# Patient Record
Sex: Female | Born: 1970
Health system: Southern US, Community
[De-identification: ages and names within clinical notes are randomized; demographics above are authoritative.]

## PROBLEM LIST (undated history)

## (undated) DIAGNOSIS — E119 Type 2 diabetes mellitus without complications: Secondary | ICD-10-CM

## (undated) DIAGNOSIS — I10 Essential (primary) hypertension: Secondary | ICD-10-CM

## (undated) DIAGNOSIS — E785 Hyperlipidemia, unspecified: Secondary | ICD-10-CM

## (undated) DIAGNOSIS — G43909 Migraine, unspecified, not intractable, without status migrainosus: Secondary | ICD-10-CM

## (undated) DIAGNOSIS — I219 Acute myocardial infarction, unspecified: Secondary | ICD-10-CM

## (undated) DIAGNOSIS — Z8619 Personal history of other infectious and parasitic diseases: Secondary | ICD-10-CM

## (undated) DIAGNOSIS — E7800 Pure hypercholesterolemia, unspecified: Secondary | ICD-10-CM

## (undated) HISTORY — DX: Hyperlipidemia, unspecified: E78.5

## (undated) HISTORY — PX: ELBOW SURGERY: SHX618

## (undated) HISTORY — DX: Migraine, unspecified, not intractable, without status migrainosus: G43.909

## (undated) HISTORY — DX: Personal history of other infectious and parasitic diseases: Z86.19

## (undated) HISTORY — DX: Essential (primary) hypertension: I10

## (undated) HISTORY — DX: Type 2 diabetes mellitus without complications: E11.9

---

## 2001-12-27 ENCOUNTER — Encounter: Payer: Self-pay | Admitting: *Deleted

## 2001-12-27 ENCOUNTER — Ambulatory Visit (HOSPITAL_COMMUNITY): Admission: RE | Admit: 2001-12-27 | Discharge: 2001-12-27 | Payer: Self-pay | Admitting: *Deleted

## 2002-03-29 ENCOUNTER — Encounter: Payer: Self-pay | Admitting: *Deleted

## 2002-03-29 ENCOUNTER — Ambulatory Visit (HOSPITAL_COMMUNITY): Admission: RE | Admit: 2002-03-29 | Discharge: 2002-03-29 | Payer: Self-pay | Admitting: *Deleted

## 2002-05-22 ENCOUNTER — Inpatient Hospital Stay (HOSPITAL_COMMUNITY): Admission: AD | Admit: 2002-05-22 | Discharge: 2002-05-24 | Payer: Self-pay | Admitting: *Deleted

## 2003-06-27 ENCOUNTER — Encounter (HOSPITAL_COMMUNITY): Admission: RE | Admit: 2003-06-27 | Discharge: 2003-07-27 | Payer: Self-pay | Admitting: Preventative Medicine

## 2004-03-13 ENCOUNTER — Ambulatory Visit (HOSPITAL_COMMUNITY): Admission: RE | Admit: 2004-03-13 | Discharge: 2004-03-13 | Payer: Self-pay | Admitting: Neurology

## 2004-07-28 ENCOUNTER — Emergency Department (HOSPITAL_COMMUNITY): Admission: EM | Admit: 2004-07-28 | Discharge: 2004-07-29 | Payer: Self-pay | Admitting: *Deleted

## 2004-08-06 ENCOUNTER — Encounter (HOSPITAL_COMMUNITY): Admission: RE | Admit: 2004-08-06 | Discharge: 2004-08-28 | Payer: Self-pay | Admitting: Preventative Medicine

## 2006-06-30 ENCOUNTER — Emergency Department (HOSPITAL_COMMUNITY): Admission: EM | Admit: 2006-06-30 | Discharge: 2006-06-30 | Payer: Self-pay | Admitting: Emergency Medicine

## 2007-10-04 ENCOUNTER — Emergency Department (HOSPITAL_COMMUNITY): Admission: EM | Admit: 2007-10-04 | Discharge: 2007-10-04 | Payer: Self-pay | Admitting: Emergency Medicine

## 2011-01-15 NOTE — Op Note (Signed)
Jaime Walls, Jaime Walls                      ACCOUNT NO.:  [id number]   MEDICAL RECORD NO.:  [id number]                   PATIENT TYPE:  INP   LOCATION:  A417                                 FACILITY:  [facility]   PHYSICIAN:  Roylene Reason. Lisette Grinder, M.D.             DATE OF BIRTH:  06/17/1971   DATE OF PROCEDURE:  05/22/2002  DATE OF DISCHARGE:                                 OPERATIVE REPORT   DELIVERY NOTE:   DIAGNOSIS:  1. A 39 week uterine pregnancy presenting in labor.  2. Moderate and severe variable decelerations during the course of labor.   DELIVERY PERFORMED:  Low vacuum extraction 5 pound 12 once female infant  delivered over an intact perineum.  Delivery performed by Dr. Roylene Reason. Lisette Grinder.   ESTIMATED BLOOD LOSS:  Less than 500 cc.   COMPLICATIONS:  None.   SPECIMENS:  1. Arterial cord gas and cord blood to pathology.  2. The placenta was examined and noted to be apparently intact with a 3-     vessel umbilical cord.   FINDINGS AT THE TIME OF DELIVERY:  Pertinently there was a thin, 3-vessel  umbilical cord, but no nuchal cord was identified.   SUMMARY:  The patient presented to [facility] in prodromal labor in  the early a.m. of 05/22/02.  She did receive 10 mg of p.o. Ambient for  therapeutic rest during the latent phase of labor.  In the a.m., then  amniotomy was performed, when the cervix was noted to be 3 cm dilated.  Clear amniotic fluid is noted.  The fetal scalp electrode was placed.  This  documented a reassuring fetal heart rate; however, with reaching the  transitional phase of labor at 8 cm, the patient began having repetitive  moderate variable decelerations.  Amnioinfusion was then performed utilizing  a total of 800 cc of sterile normal saline through an intrauterine pressure  catheter.  This failed to give any significant resolution of the variable  decelerations; however, the patient was noted to be progressing rapidly in  the course of  labor.   With reduction of the anterior lip of the cervix during a contraction the  patient reached complete dilatation after which time she was placed in the  dorsolithotomy position and prepped and draped in the usual sterile manner  and encouraged to push with each uterine contraction.  The patient, at this  point in time, had increasing severity of the decelerations.  At one point  the fetal heart rate was noted to be 40 beats/minute.  Thus a Kiwi vacuum  extractor was utilized to perform a low vacuum extraction.  The infant's  vertex noted to be in an ROA position.  The Kiwi vacuum extractor was then  applied to the infant's vertex, appropriate application was confirmed.  Gentle suction was then applied, at all times, keeping within the safe green  range according the Kiwi pump.   On  the subsequent next contraction with gentle traction and expulsive  efforts there was easy descent of the vertex to the perineal floor followed  by delivery, without difficulty, over the intact perineum.  The mouth and  nares were bulb suctioned of clear amniotic fluid.  The umbilical cord was  milked towards the infant.  The cord was doubly clamped, and cut.  The  infant was handed to the waiting nursing staff for immediate assessment.  A  spontaneous and vigorous breathing cry is noted.  Arterial cord gases and  cord blood were then obtained from the placenta.   Gentle traction on the placenta results in separation which, upon  examination, appears to be the intact placenta with a 3-vessel umbilical  cord, although the cord is noted to be narrow.  Excellent uterine tone was  achieved immediately following delivery.  Examination of the genital tract  reveals an intact perineum.  In addition, There were no vaginal sidewall or  cervical lacerations.  The mother and infant both are doing very well  following delivery.  There were no complications.                                               Donald  P. Lisette Grinder, M.D.    DPC/MEDQ  D:  05/23/2002  T:  05/23/2002  Job:  [id number]   cc:   Francoise Schaumann. Halm, D.O.

## 2011-01-15 NOTE — Discharge Summary (Signed)
   NAMECHANNON, BROUGHER                      ACCOUNT NO.:  [id number]   MEDICAL RECORD NO.:  [id number]                   PATIENT TYPE:  INP   LOCATION:  A417                                 FACILITY:  [facility]   PHYSICIAN:  Roylene Reason. Lisette Grinder, M.D.             DATE OF BIRTH:  [birth year]   DATE OF ADMISSION:  05/21/2002  DATE OF DISCHARGE:  05/24/2002                                 DISCHARGE SUMMARY   DIAGNOSIS:  Intrauterine pregnancy at 39 weeks presenting in prodromal  stages of labor.  Delivery performed on May 22, 2002 as low vacuum  extraction, viable vigorous female infant.  The labor course was complicated  by findings of moderate and occasionally severe variable decelerations.  However, no nuchal cord was identified at time of delivery.  Delivery did  occur over an intact perineum.  Analgesia during the course of labor  consisting of IV narcotics only.   DISPOSITION:  The patient is to follow up in the office in four weeks time.  She is interested in that time in either utilizing contraceptive patch or  birth control pills.   PERTINENT LABORATORY DATA:  O positive blood type.  Hemoglobin 12.6,  hematocrit 37.5, white count 9.8.  Postpartum day #1 white count 21.2 with  hemoglobin 9.5, hematocrit 27.9.  On postpartum day #2 hemoglobin 9.5,  hematocrit 28.2, with a white blood count of 14.2.   ADDITIONAL DIAGNOSIS:  Anemia secondary to blood loss due to uterine atony.  The patient was treated with additional IV Pitocin as well as receiving a  single injection of 0.2 mg of IM Methergine.   HOSPITAL COURSE:  See previous dictations.  The patient delivered  uneventfully on May 22, 2002 utilizing a low vacuum extractor.  No  lacerations were noted at time of delivery.  Postpartum, the patient did  very well.  She initially had excellent uterine tone.  However, in the  postpartum area the patient was noted to have experienced some uterine atony  which was treated  with 02. mg of IM Methergine.  She thereafter was noted to  expel a large baseball-sized clot with significant decrease in the size of  the uterus and no further active bleeding.  The patient continued to do well  in the postpartum period.  She was noted to be breast feeding.  Group B  strep status was unknown.  Thus, the patient was discharged to home on  postpartum day #2.                                               Donald P. Lisette Grinder, M.D.    DPC/MEDQ  D:  05/28/2002  T:  05/29/2002  Job:  [id number]

## 2012-02-18 ENCOUNTER — Other Ambulatory Visit (HOSPITAL_COMMUNITY): Payer: Self-pay | Admitting: Internal Medicine

## 2012-02-18 DIAGNOSIS — Z01419 Encounter for gynecological examination (general) (routine) without abnormal findings: Secondary | ICD-10-CM

## 2012-02-18 DIAGNOSIS — Z139 Encounter for screening, unspecified: Secondary | ICD-10-CM

## 2012-02-21 ENCOUNTER — Ambulatory Visit (HOSPITAL_COMMUNITY): Payer: Self-pay

## 2012-02-28 ENCOUNTER — Ambulatory Visit (HOSPITAL_COMMUNITY)
Admission: RE | Admit: 2012-02-28 | Discharge: 2012-02-28 | Disposition: A | Discharge status: Home / Self Care | Payer: 59 | Source: Ambulatory Visit | Attending: Internal Medicine | Admitting: Internal Medicine

## 2012-02-28 DIAGNOSIS — Z01419 Encounter for gynecological examination (general) (routine) without abnormal findings: Secondary | ICD-10-CM

## 2012-02-28 DIAGNOSIS — Z1231 Encounter for screening mammogram for malignant neoplasm of breast: Secondary | ICD-10-CM | POA: Insufficient documentation

## 2012-02-28 DIAGNOSIS — Z139 Encounter for screening, unspecified: Secondary | ICD-10-CM

## 2012-03-07 ENCOUNTER — Other Ambulatory Visit: Payer: Self-pay | Admitting: Internal Medicine

## 2012-03-07 DIAGNOSIS — R928 Other abnormal and inconclusive findings on diagnostic imaging of breast: Secondary | ICD-10-CM

## 2012-03-13 ENCOUNTER — Other Ambulatory Visit: Payer: Self-pay | Admitting: Internal Medicine

## 2012-03-13 DIAGNOSIS — R928 Other abnormal and inconclusive findings on diagnostic imaging of breast: Secondary | ICD-10-CM

## 2012-03-16 ENCOUNTER — Ambulatory Visit
Admission: RE | Admit: 2012-03-16 | Discharge: 2012-03-16 | Disposition: A | Discharge status: Home / Self Care | Payer: 59 | Source: Ambulatory Visit | Attending: Internal Medicine | Admitting: Internal Medicine

## 2012-03-16 DIAGNOSIS — R928 Other abnormal and inconclusive findings on diagnostic imaging of breast: Secondary | ICD-10-CM

## 2014-02-13 ENCOUNTER — Other Ambulatory Visit (HOSPITAL_COMMUNITY): Payer: Self-pay | Admitting: Family Medicine

## 2014-02-13 DIAGNOSIS — Z1231 Encounter for screening mammogram for malignant neoplasm of breast: Secondary | ICD-10-CM

## 2014-02-18 ENCOUNTER — Ambulatory Visit (HOSPITAL_COMMUNITY)
Admission: RE | Admit: 2014-02-18 | Discharge: 2014-02-18 | Disposition: A | Discharge status: Home / Self Care | Payer: 59 | Source: Ambulatory Visit | Attending: Family Medicine | Admitting: Family Medicine

## 2014-02-18 DIAGNOSIS — Z1231 Encounter for screening mammogram for malignant neoplasm of breast: Secondary | ICD-10-CM | POA: Insufficient documentation

## 2014-04-09 ENCOUNTER — Other Ambulatory Visit (HOSPITAL_COMMUNITY): Payer: Self-pay | Admitting: Family Medicine

## 2014-04-09 DIAGNOSIS — N644 Mastodynia: Secondary | ICD-10-CM

## 2014-04-09 DIAGNOSIS — R928 Other abnormal and inconclusive findings on diagnostic imaging of breast: Secondary | ICD-10-CM

## 2014-04-23 ENCOUNTER — Ambulatory Visit (HOSPITAL_COMMUNITY)
Admission: RE | Admit: 2014-04-23 | Discharge: 2014-04-23 | Disposition: A | Discharge status: Home / Self Care | Payer: BC Managed Care – PPO | Source: Ambulatory Visit | Attending: Family Medicine | Admitting: Family Medicine

## 2014-04-23 ENCOUNTER — Other Ambulatory Visit (HOSPITAL_COMMUNITY): Payer: Self-pay | Admitting: Family Medicine

## 2014-04-23 DIAGNOSIS — R928 Other abnormal and inconclusive findings on diagnostic imaging of breast: Secondary | ICD-10-CM

## 2014-04-23 DIAGNOSIS — N644 Mastodynia: Secondary | ICD-10-CM

## 2015-04-23 ENCOUNTER — Other Ambulatory Visit (HOSPITAL_COMMUNITY): Payer: Self-pay | Admitting: Physician Assistant

## 2015-04-23 DIAGNOSIS — Z1231 Encounter for screening mammogram for malignant neoplasm of breast: Secondary | ICD-10-CM

## 2015-04-28 ENCOUNTER — Ambulatory Visit (HOSPITAL_COMMUNITY): Payer: Self-pay

## 2015-06-09 ENCOUNTER — Other Ambulatory Visit: Payer: Self-pay | Admitting: Obstetrics and Gynecology

## 2015-06-09 ENCOUNTER — Encounter: Payer: Self-pay | Admitting: *Deleted

## 2015-06-12 ENCOUNTER — Encounter: Payer: Self-pay | Admitting: *Deleted

## 2016-08-31 ENCOUNTER — Encounter (INDEPENDENT_AMBULATORY_CARE_PROVIDER_SITE_OTHER): Payer: Self-pay

## 2016-08-31 ENCOUNTER — Ambulatory Visit (INDEPENDENT_AMBULATORY_CARE_PROVIDER_SITE_OTHER): Payer: Self-pay

## 2016-08-31 ENCOUNTER — Encounter (INDEPENDENT_AMBULATORY_CARE_PROVIDER_SITE_OTHER): Payer: Self-pay | Admitting: Orthopaedic Surgery

## 2016-08-31 ENCOUNTER — Ambulatory Visit (INDEPENDENT_AMBULATORY_CARE_PROVIDER_SITE_OTHER): Payer: Worker's Compensation | Admitting: Orthopaedic Surgery

## 2016-08-31 DIAGNOSIS — M25522 Pain in left elbow: Secondary | ICD-10-CM

## 2016-08-31 NOTE — Progress Notes (Signed)
   Office Visit Note   Patient: Jaime JarvisVeronica O Walls           Date of Birth: 08/07/1971           MRN: [id number] Visit Date: 08/31/2016              Requested by: Assunta FoundJohn Golding, MD [address] [place], [place] [address] PCP: Colette RibasGOLDING, JOHN CABOT, MD   Assessment & Plan: Visit Diagnoses:  1. Pain in left elbow     Plan: Given failure of conservative treatment recommend MRI to fully evaluate the elbow for structural abnormality. Follow-up after the MRI. Today's encounter was made more complex given the language barrier.Total face to face encounter time was greater than 45 minutes and over half of this time was spent in counseling and/or coordination of care.   Follow-Up Instructions: Return in about 2 weeks (around 09/14/2016) for review MRI left elbow.   Orders:  Orders Placed This Encounter  Procedures  . XR Elbow 2 Views Left  . MR Elbow Left w/o contrast   No orders of the defined types were placed in this encounter.     Procedures: No procedures performed   Clinical Data: No additional findings.   Subjective: Chief Complaint  Patient presents with  . Left Elbow - Injury    Patient is a 46 year old female who does not speak any English who presents today with her interpreter. She injured her left elbow at work when some he pushed a metal table directly into the lateral aspect of her elbow on 06/21/2016. She has been seeing another doctor who has been treating this conservatively. She is been doing modified duty 7 days a week. Her pain level is 9 out of 10 when working and better when resting. She does have a numbness feeling. The pain is related to activity. She does not have any radiation of pain. She denies any motor or sensory deficits. She has had physical therapy and injections which did not provide her with any sustained relief. Denies constitutional symptoms.    Review of Systems Complete review of systems is negative except for history of present  illness  Objective: Vital Signs: There were no vitals taken for this visit.  Physical Exam Well-developed well-nourished no acute distress alert 3 nonlabored breathing normal judgments affect abdomen soft no lymphadenopathy skin is intact extraocular movements intact no audible thrills trachea midline Ortho Exam Exam of the left elbow shows no swelling or skin lesions. She is neurovascularly intact. She has full range of motion. She is slightly tender over the lateral upper condyle. Radial head is nontender. Radial tunnel is nontender. Negative Tinel or radial tunnel. No pain with resisted middle finger extension. She has good elbow flexion and extension strength. Full supination and pronation. Elbow was ligamentously stable. Specialty Comments:  No specialty comments available.  Imaging: Xr Elbow 2 Views Left  Result Date: 08/31/2016 negative    PMFS History: Patient Active Problem List   Diagnosis Date Noted  . Pain in left elbow 08/31/2016   No past medical history on file.  No family history on file.  No past surgical history on file. Social History   Occupational History  . Not on file.   Social History Main Topics  . Smoking status: Never Smoker  . Smokeless tobacco: Never Used  . Alcohol use Not on file  . Drug use: Unknown  . Sexual activity: Not on file

## 2016-10-27 ENCOUNTER — Telehealth (INDEPENDENT_AMBULATORY_CARE_PROVIDER_SITE_OTHER): Payer: Self-pay

## 2016-10-27 NOTE — Telephone Encounter (Signed)
Called pt no answer, LMOM she needs to sched appt with us to review MRI Report.

## 2016-10-28 ENCOUNTER — Telehealth (INDEPENDENT_AMBULATORY_CARE_PROVIDER_SITE_OTHER): Payer: Self-pay

## 2016-10-28 NOTE — Telephone Encounter (Signed)
LMOM to let her know she needs to follow up with us to review MRI REPORT w/ Dr Roda ShuttersXu.

## 2016-11-02 ENCOUNTER — Encounter (INDEPENDENT_AMBULATORY_CARE_PROVIDER_SITE_OTHER): Payer: Self-pay | Admitting: Orthopaedic Surgery

## 2016-11-02 ENCOUNTER — Ambulatory Visit (INDEPENDENT_AMBULATORY_CARE_PROVIDER_SITE_OTHER): Payer: Worker's Compensation | Admitting: Orthopaedic Surgery

## 2016-11-02 ENCOUNTER — Ambulatory Visit (INDEPENDENT_AMBULATORY_CARE_PROVIDER_SITE_OTHER): Payer: Self-pay | Admitting: Orthopaedic Surgery

## 2016-11-02 DIAGNOSIS — M25522 Pain in left elbow: Secondary | ICD-10-CM | POA: Diagnosis not present

## 2016-11-02 NOTE — Progress Notes (Signed)
   Office Visit Note   Patient: Jaime JarvisVeronica O Walls           Date of Birth: 11/16/1970           MRN: [id number] Visit Date: 11/02/2016              Requested by: Assunta FoundJohn Golding, MD [address] [place], [place] [address] PCP: Colette RibasGOLDING, JOHN CABOT, MD   Assessment & Plan: Visit Diagnoses:  1. Pain in left elbow     Plan: MRI is essentially normal.  I think she has clinical lateral epicondylitis that's worse with repetitive duties. 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 I detailed this in the work restriction note. Patient has reached MMI with a 0% permanent partial impairment. See her back in.  Follow-Up Instructions: Return if symptoms worsen or fail to improve.   Orders:  No orders of the defined types were placed in this encounter.  No orders of the defined types were placed in this encounter.     Procedures: No procedures performed   Clinical Data: No additional findings.   Subjective: Chief Complaint  Patient presents with  . Left Elbow - Pain    Patient follows up today to review her MRI. 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 This is better with rest.    Review of Systems  Constitutional: Negative.   HENT: Negative.   Eyes: Negative.   Respiratory: Negative.   Cardiovascular: Negative.   Endocrine: Negative.   Musculoskeletal: Negative.   Neurological: Negative.   Hematological: Negative.   Psychiatric/Behavioral: Negative.   All other systems reviewed and are negative.    Objective: Vital Signs: There were no vitals taken for this visit.  Physical Exam  Constitutional: She is oriented to person, place, and time. She appears well-developed and well-nourished.  HENT:  Head: Normocephalic and atraumatic.  Eyes: EOM are normal.  Neck: Neck supple.  Pulmonary/Chest: Effort normal.    Abdominal: Soft.  Neurological: She is alert and oriented to person, place, and time.  Skin: Skin is warm. Capillary refill takes less than 2 seconds.  Psychiatric: She has a normal mood and affect. Her behavior is normal. Judgment and thought content normal.  Nursing note and vitals reviewed.   Ortho Exam Exam of the left elbow shows no swelling. Mild tenderness over the lateral epicondyle. Full joint range of motion Specialty Comments:  No specialty comments available.  Imaging: No results found.   PMFS History: Patient Active Problem List   Diagnosis Date Noted  . Pain in left elbow 08/31/2016   No past medical history on file.  No family history on file.  No past surgical history on file. Social History   Occupational History  . Not on file.   Social History Main Topics  . Smoking status: Never Smoker  . Smokeless tobacco: Never Used  . Alcohol use Not on file  . Drug use: Unknown  . Sexual activity: Not on file

## 2016-11-03 ENCOUNTER — Telehealth (INDEPENDENT_AMBULATORY_CARE_PROVIDER_SITE_OTHER): Payer: Self-pay

## 2016-11-03 NOTE — Telephone Encounter (Addendum)
They called wanting the letter to be modified to be more specific from her job. Needs to specify this is only regarding "left side" not both. 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 She is on permanent restrictions of no heavy lifting greater than 30 pounds, no repetitive duties, and no assembly line work."   Is is okay to modify?  CB [phone] FAX [phone]

## 2016-11-03 NOTE — Telephone Encounter (Signed)
yes

## 2016-11-04 NOTE — Telephone Encounter (Signed)
Faxed note [phone]

## 2017-04-05 ENCOUNTER — Emergency Department (HOSPITAL_COMMUNITY): Payer: BLUE CROSS/BLUE SHIELD

## 2017-04-05 ENCOUNTER — Emergency Department (HOSPITAL_COMMUNITY)
Admission: EM | Admit: 2017-04-05 | Discharge: 2017-04-05 | Disposition: A | Discharge status: Home / Self Care | Payer: BLUE CROSS/BLUE SHIELD | Attending: Emergency Medicine | Admitting: Emergency Medicine

## 2017-04-05 ENCOUNTER — Encounter (HOSPITAL_COMMUNITY): Payer: Self-pay | Admitting: Emergency Medicine

## 2017-04-05 DIAGNOSIS — I10 Essential (primary) hypertension: Secondary | ICD-10-CM | POA: Diagnosis not present

## 2017-04-05 DIAGNOSIS — R079 Chest pain, unspecified: Secondary | ICD-10-CM | POA: Insufficient documentation

## 2017-04-05 DIAGNOSIS — Z79899 Other long term (current) drug therapy: Secondary | ICD-10-CM | POA: Diagnosis not present

## 2017-04-05 DIAGNOSIS — R0600 Dyspnea, unspecified: Secondary | ICD-10-CM | POA: Diagnosis not present

## 2017-04-05 DIAGNOSIS — Z7984 Long term (current) use of oral hypoglycemic drugs: Secondary | ICD-10-CM | POA: Diagnosis not present

## 2017-04-05 DIAGNOSIS — E119 Type 2 diabetes mellitus without complications: Secondary | ICD-10-CM | POA: Diagnosis not present

## 2017-04-05 DIAGNOSIS — R0602 Shortness of breath: Secondary | ICD-10-CM | POA: Diagnosis present

## 2017-04-05 HISTORY — DX: Essential (primary) hypertension: I10

## 2017-04-05 HISTORY — DX: Acute myocardial infarction, unspecified: I21.9

## 2017-04-05 HISTORY — DX: Type 2 diabetes mellitus without complications: E11.9

## 2017-04-05 HISTORY — DX: Pure hypercholesterolemia, unspecified: E78.00

## 2017-04-05 LAB — BASIC METABOLIC PANEL
Anion gap: 8 (ref 5–15)
BUN: 16 mg/dL (ref 6–20)
CALCIUM: 9.1 mg/dL (ref 8.9–10.3)
CO2: 26 mmol/L (ref 22–32)
Chloride: 108 mmol/L (ref 101–111)
Creatinine, Ser: 0.83 mg/dL (ref 0.44–1.00)
GFR calc Af Amer: >60 mL/min (ref >60)
GLUCOSE: 115 mg/dL — AB (ref 65–99)
POTASSIUM: 4.1 mmol/L (ref 3.5–5.1)
SODIUM: 142 mmol/L (ref 135–145)

## 2017-04-05 LAB — CBC WITH DIFFERENTIAL/PLATELET
BASOS ABS: 0 10*3/uL (ref 0.0–0.1)
BASOS PCT: 0 %
EOS ABS: 0.1 10*3/uL (ref 0.0–0.7)
EOS PCT: 1 %
HCT: 38.2 % (ref 36.0–46.0)
Hemoglobin: 12.5 g/dL (ref 12.0–15.0)
LYMPHS PCT: 36 %
Lymphs Abs: 3.2 10*3/uL (ref 0.7–4.0)
MCH: 29.6 pg (ref 26.0–34.0)
MCHC: 32.7 g/dL (ref 30.0–36.0)
MCV: 90.3 fL (ref 78.0–100.0)
MONO ABS: 0.7 10*3/uL (ref 0.1–1.0)
Monocytes Relative: 8 %
Neutro Abs: 5 10*3/uL (ref 1.7–7.7)
Neutrophils Relative %: 55 %
PLATELETS: 308 10*3/uL (ref 150–400)
RBC: 4.23 MIL/uL (ref 3.87–5.11)
RDW: 13.6 % (ref 11.5–15.5)
WBC: 9 10*3/uL (ref 4.0–10.5)

## 2017-04-05 LAB — TROPONIN I

## 2017-04-05 MED ORDER — ASPIRIN 81 MG PO CHEW
324.0000 mg | CHEWABLE_TABLET | Freq: Once | ORAL | Status: AC
  Administered 2017-04-05: 324 mg via ORAL
  Filled 2017-04-05: qty 4

## 2017-04-05 NOTE — ED Provider Notes (Signed)
[facility]-EMERGENCY DEPT Provider Note   CSN: [id number] Arrival date & time: 04/05/17  1628     History   Chief Complaint Chief Complaint  Patient presents with  . Chest Pain  . Shortness of Breath    HPI Jaime Walls is a 46 y.o. female.  HPI  The pt is a 46 y/o female - she reports no hx of HTN, Cholesterol or DM, she reports having a heart attack when her heart broke in relation to splitting with her husband in 2007 (during which time she came to the hospital in a catatonic state and was told she had depression and an MI) - there is no record of this that I can find - pt states she was told she needed to see a PCP and has f/u with PCP since that time - she reports that since having elbow surgery (L) a month ago she has had intermittent SOB when she awakens in the morning, intermittent CP which is better when she exerts herself and worse when she lays down.  Not associated with swelling of the arms and legs - there is no drug use, tob or ETOH.  She has improved sx with exertion, worse with laying down - not associated with coughing or fevers.  She has no other symptoms.    Past Medical History:  Diagnosis Date  . Diabetes mellitus without complication (HCC)   . Heart attack (HCC)   . High cholesterol   . Hypertension     Patient Active Problem List   Diagnosis Date Noted  . Pain in left elbow 08/31/2016    Past Surgical History:  Procedure Laterality Date  . ELBOW SURGERY      OB History    Gravida Para Term Preterm AB Living   0             SAB TAB Ectopic Multiple Live Births                   Home Medications    Prior to Admission medications   Medication Sig Start Date End Date Taking? Authorizing Provider  acetaminophen (TYLENOL) 500 MG tablet Take 500 mg by mouth every 6 (six) hours as needed for mild pain or moderate pain.   Yes [provider]  atorvastatin (LIPITOR) 20 MG tablet Take 20 mg by mouth daily.   Yes [provider]  Butalbital-APAP-Caffeine 50-300-40 MG CAPS Take 1 tablet by mouth every 6 (six) hours as needed (for pain).    Yes [provider]  lisinopril (PRINIVIL,ZESTRIL) 5 MG tablet Take 5 mg by mouth daily.   Yes [provider]  metFORMIN (GLUCOPHAGE) 500 MG tablet Take 500 mg by mouth 2 (two) times daily with a meal.   Yes [provider]  omeprazole (PRILOSEC) 20 MG capsule Take 20 mg by mouth daily.   Yes [provider]    Family History History reviewed. No pertinent family history.  Social History Social History  Substance Use Topics  . Smoking status: Never Smoker  . Smokeless tobacco: Never Used  . Alcohol use No     Allergies   Patient has no known allergies.   Review of Systems Review of Systems  All other systems reviewed and are negative.    Physical Exam Updated Vital Signs BP 133/76   Pulse 88   Temp 98.8 F (37.1 C) (Oral)   Resp 16   Ht 5\' 3"  (1.6 m)   Wt 84.4  kg (186 lb)   LMP 03/21/2017 (Exact Date)   SpO2 99%   BMI 32.95 kg/m   Physical Exam  Constitutional: She appears well-developed and well-nourished. No distress.  HENT:  Head: Normocephalic and atraumatic.  Mouth/Throat: Oropharynx is clear and moist. No oropharyngeal exudate.  Eyes: Pupils are equal, round, and reactive to light. Conjunctivae and EOM are normal. Right eye exhibits no discharge. Left eye exhibits no discharge. No scleral icterus.  Neck: Normal range of motion. Neck supple. No JVD present. No thyromegaly present.  Cardiovascular: Normal rate, regular rhythm, normal heart sounds and intact distal pulses.  Exam reveals no gallop and no friction rub.   No murmur heard. Pulmonary/Chest: Effort normal and breath sounds normal. No respiratory distress. She has no wheezes. She has no rales.  Abdominal: Soft. Bowel sounds are normal. She exhibits no distension and no mass. There is no tenderness.  Musculoskeletal: Normal range of motion.  She exhibits no edema or tenderness.  Lymphadenopathy:    She has no cervical adenopathy.  Neurological: She is alert. Coordination normal.  Skin: Skin is warm and dry. No rash noted. No erythema.  Psychiatric: She has a normal mood and affect. Her behavior is normal.  Nursing note and vitals reviewed.    ED Treatments / Results  Labs (all labs ordered are listed, but only abnormal results are displayed) Labs Reviewed  BASIC METABOLIC PANEL - Abnormal; Notable for the following:       Result Value   Glucose, Bld 115 (*)    All other components within normal limits  CBC WITH DIFFERENTIAL/PLATELET  TROPONIN I    EKG  EKG Interpretation  Date/Time:  Tuesday April 05 2017 16:39:05 EDT Ventricular Rate:  98 PR Interval:    QRS Duration: 96 QT Interval:  357 QTC Calculation: 456 R Axis:   93 Text Interpretation:  Sinus rhythm Borderline right axis deviation Low voltage, precordial leads Baseline wander in lead(s) II III aVR aVL aVF V3 V5 No old tracing to compare Confirmed by Eber HongMiller, Madaleine Simmon ([id number]) on 04/05/2017 6:59:27 PM       Radiology Dg Chest 2 View  Result Date: 04/05/2017 CLINICAL DATA:  Chest pain and shortness of breath EXAM: CHEST  2 VIEW COMPARISON:  July 29, 2004 FINDINGS: Lungs are clear. Heart size and pulmonary vascularity are normal. No adenopathy. No pneumothorax. No bone lesions. IMPRESSION: No edema or consolidation. Electronically Signed   By: Bretta BangWilliam  Woodruff III M.D.   On: 04/05/2017 18:44    Procedures Procedures (including critical care time)  Medications Ordered in ED Medications  aspirin chewable tablet 324 mg (324 mg Oral Given 04/05/17 1730)     Initial Impression / Assessment and Plan / ED Course  I have reviewed the triage vital signs and the nursing notes.  Pertinent labs & imaging results that were available during my care of the patient were reviewed by me and considered in my medical decision making (see chart for details).      The patient has an unremarkable exam with no murmurs, no abnormal lung sounds, she is very well-appearing, calm, vital signs are unremarkable, EKG is totally normal. I suspected the patient did not in fact have a myocardial infarction in 2007 but I do suspect this was more of a emotional breakdown in relation to her relationship instability. At this time the patient is not undergoing any significant stresses other than healing from her surgery. Her EKG being normal and hopefully lab work to match should prompt further  outpatient evaluation. She does not appear to have a tachycardia or hypoxia or swelling of the legs or arms that would make me concerned for venous thrombosis or venous thromboembolism. Her elbow surgery was a ligamentous surgery around the left elbow and there is no edema or pain or swelling of the left arm. There is no symptoms to suggest exertional chest pain or acute coronary syndrome pathology. She does not have significant hypertension and has equal pulses making aortic dissection less likely. There is no murmurs rubs or gallops making pericarditis less likely especially without any PR depression or ST elevation on the EKG.  Chest x-ray images reviewed, I agree with the interpretation of the radiologist that there is no acute findings of either edema or consolidation nor is there any signs of mediastinal abnormality or subdiaphragmatic air. Labs are unremarkable with normal blood counts, normal troponin and normal metabolic panel. The EKG is reassuring with no signs of ST elevation or depression or arrhythmia.  The patient will be informed of these results, she appears stable for discharge to follow-up in the outpatient setting. Doubt acute pathology, anti-inflammatories as needed for pain and home.   Final Clinical Impressions(s) / ED Diagnoses   Final diagnoses:  None    New Prescriptions New Prescriptions   No medications on file     Eber Hong, MD 04/05/17 1900

## 2017-04-05 NOTE — ED Triage Notes (Addendum)
Pt reports cp and sob since surgery of left elbow 2-3 weeks ago.  Pt also has posterior head pain since before her surgery.  Pt has hx of MI in 2007.

## 2017-04-05 NOTE — ED Notes (Signed)
EKG given to Dr. Miller.  

## 2017-04-05 NOTE — Discharge Instructions (Signed)
Your testing was normal Your xray was normal Your heart tests were normal  See your doctor for recheck in 1 week if no better ER for worsening symptoms  Ibuprofen for pain Increase your exercise this week

## 2018-09-11 ENCOUNTER — Other Ambulatory Visit: Payer: Self-pay | Admitting: Advanced Practice Midwife

## 2018-09-27 ENCOUNTER — Other Ambulatory Visit (HOSPITAL_COMMUNITY)
Admission: RE | Admit: 2018-09-27 | Discharge: 2018-09-27 | Disposition: A | Discharge status: Home / Self Care | Payer: BLUE CROSS/BLUE SHIELD | Source: Ambulatory Visit | Attending: Advanced Practice Midwife | Admitting: Advanced Practice Midwife

## 2018-09-27 ENCOUNTER — Ambulatory Visit (INDEPENDENT_AMBULATORY_CARE_PROVIDER_SITE_OTHER): Payer: BLUE CROSS/BLUE SHIELD | Admitting: Advanced Practice Midwife

## 2018-09-27 ENCOUNTER — Encounter: Payer: Self-pay | Admitting: Advanced Practice Midwife

## 2018-09-27 VITALS — BP 157/96 | HR 76 | Ht 63.5 in | Wt 190.0 lb

## 2018-09-27 DIAGNOSIS — Z01419 Encounter for gynecological examination (general) (routine) without abnormal findings: Secondary | ICD-10-CM | POA: Diagnosis not present

## 2018-09-27 NOTE — Progress Notes (Signed)
Jaime Walls 48 y.o.  Vitals:   09/27/18 1547 09/27/18 1603  BP: (!) 156/101 (!) 157/96  Pulse: 80 76     Filed Weights   09/27/18 1547  Weight: 190 lb (86.2 kg)    Past Medical History: Past Medical History:  Diagnosis Date  . Diabetes mellitus without complication (HCC)   . Heart attack (HCC)   . High cholesterol   . Hypertension     Past Surgical History: Past Surgical History:  Procedure Laterality Date  . ELBOW SURGERY      Family History: Family History  Problem Relation Age of Onset  . Miscarriages / [place] Son   . Stroke Father   . Diabetes Mother   . Hypertension Mother   . Other Mother        breathing issues; knee pain    Social History: Social History   Tobacco Use  . Smoking status: Never Smoker  . Smokeless tobacco: Never Used  Substance Use Topics  . Alcohol use: No  . Drug use: No    Allergies: No Known Allergies    Current Outpatient Medications:  .  acetaminophen (TYLENOL) 500 MG tablet, Take 500 mg by mouth every 6 (six) hours as needed for mild pain or moderate pain., Disp: , Rfl:  .  Butalbital-APAP-Caffeine 50-300-40 MG CAPS, Take 1 tablet by mouth every 6 (six) hours as needed (for pain). , Disp: , Rfl:  .  lisinopril (PRINIVIL,ZESTRIL) 10 MG tablet, Take 10 mg by mouth as needed., Disp: , Rfl:  .  meloxicam (MOBIC) 15 MG tablet, Take 15 mg by mouth as needed for pain., Disp: , Rfl:   History of Present Illness: here for pap and physical . PCP  Robbie Lis) takes care of HTN, ^ cholesterol, etc. Takes BP meds only when feels bad, probalby hasn't in a year. .  Mammogram 2015 normal,  wants to be pregnant.  Has mo nthly periods. Discussed fertility in older wormen. PNV recommended.    Review of Systems   Patient denies any headaches, blurred vision, shortness of breath, chest pain, abdominal pain, problems with bowel movements, urination, or intercourse.   Physical Exam: General:  Well developed, well  nourished, no acute distress Skin:  Warm and dry Neck:  Midline trachea, normal thyroid Lungs; Clear to auscultation bilaterally Breast:  No dominant palpable mass, retraction, or nipple discharge Cardiovascular: Regular rate and rhythm Abdomen:  Soft, non tender, no hepatosplenomegaly Pelvic:  External genitalia is normal in appearance.  The vagina is normal in appearance.  The cervix is bulbous.  Uterus is felt to be normal size, shape, and contour.  No adnexal masses or tenderness noted.  Extremities:  No swelling or varicosities noted Psych:  No mood changes.     Impression: normal GYN exam.    HTN--restart meds Get mammogram q year Fertility discussion. Aware that she may need assistance Plan: If pap normal, repeat q 3 years

## 2018-09-27 NOTE — Patient Instructions (Addendum)
Mammogram   [phone]  Please take blood pressure medicine   Fascitis plantar Plantar Fasciitis  La fascitis plantar es una afeccin dolorosa que se produce en el taln. Ocurre cuando la banda de tejido que AT&Tconecta los dedos con el hueso del taln (fascia plantar) se irrita. Esto puede ocurrir por Academic librarianhacer mucho ejercicio u otras actividades repetitivas (lesin por uso excesivo). El dolor de la fascitis plantar puede abarcar desde una leve irritacin a dolor intenso, y en los casos ms agudos puede dificultar que la persona camine o se Flemingtonmueva. Por lo general, el dolor es peor a la maana despus de dormir, o despus de Personal assistantpermanecer sentado o acostado durante un tiempo. El dolor tambin puede empeorar despus de caminar o estar de pie por Con-waymucho tiempo. Cules son las causas? Esta afeccin puede ser causada por lo siguiente:  Estar de pie durante largos perodos.  Usar zapatos que no tengan un buen soporte para el arco.  Practicar actividades que ponen tensin en las articulaciones (actividades de alto impacto), como correr, Radio producerhacer ejercicios aerbicos o Market researcherpracticar ballet.  Tener sobrepeso.  Tener una forma de caminar (un andar) anormal.  Presentar rigidez muscular en la parte posterior de la parte inferior de la pierna (pantorrilla).  Tener un arco alto de los pies.  Comenzar una nueva actividad fsica. Cules son los signos o los sntomas? El sntoma principal de esta afeccin es el dolor en el taln. El dolor:  Puede empeorar con los primeros pasos luego de estar en reposo, especialmente por la maana despus de dormir o de haber estado sentado o acostado durante [place]o tiempo.  Puede empeorar despus de largos perodos de estar parado.  Puede disminuir despus de 30 a 45 minutos de [place] Grenadinesactividad, como caminar apaciblemente. Cmo se diagnostica? Esta afeccin puede diagnosticarse en funcin de sus antecedentes mdicos y sus sntomas. Su mdico puede preguntarle sobre su nivel de  Gliddenactividad. El Production managermdico le realizar un examen fsico para controlar si tiene lo siguiente:  Un rea dolorida en la parte inferior del pie.  El arco alto.  Dolor al Doctor, general practicemover el pie.  Dificultad para mover el pie. Pueden realizarle estudios de diagnstico por imagen para confirmar el diagnstico, por ejemplo:  Radiografas.  Ecografa.  Resonancia magntica (RM). Cmo se trata? El tratamiento de la fascitis plantar depende de la gravedad de su afeccin. El tratamiento puede incluir lo siguiente:  Reposo, hielo, presin (compresin) y Lexicographerlevantar el pie afectado (elevacin). Esto puede conocerse como Terapia de RHCE (Reposo, hielo, compresin, elevacin). El mdico puede recomendarle Terapia de RHCE junto con medicamentos de venta libre para Engineer, materialsaliviar el dolor.  Ejercicios para estirar las pantorrillas y la fascia plantar.  Una frula que UGI Corporationmantiene el pie estirado y Mal[place]acia arriba mientras usted duerme (frula nocturna).  Fisioterapia para Eastman Kodakaliviar los sntomas y Physiological scientistevitar problemas en el futuro.  Inyecciones de corticoesteroides para Engineer, materialsaliviar el dolor y la inflamacin.  Estimular su fascia plantar lesionada con impulsos elctricos (tratamiento con ondas de choque extracorprea). Esto generalmente es la ltima opcin de tratamiento antes de la Azerbaijanciruga.  Ciruga, si los otros tratamientos no han funcionado despus de 12meses. Siga estas indicaciones en su casa:  Control del dolor, la rigidez y la hinchazn  Si se lo indican, aplique hielo sobre la zona del dolor: ? Ponga el hielo en una bolsa de plstico o use una botella de agua congelada. ? Coloque una FirstEnergy Corptoalla entre la piel y la bolsa de hielo o la botella. ? Frote la parte inferior del pie  sobre la bolsa o la botella. ? Haga esto durante , de 2a 3veces al [employer].  Use calzado deportivo con amortiguacin de aire o gel, o intente usar plantillas blandas diseadas para la fascitis plantar.  Cuando est sentado o acostado, levante  (eleve) el pie por encima del nivel del corazn. Actividad  Evite las actividades que causan dolor. Pregntele al mdico qu actividades son seguras para usted.  Haga los ejercicios de fisioterapia y estiramiento como se lo haya indicado el mdico.  Intente hacer actividades y tipos de ejercicio que sean ms fciles para las articulaciones (de bajo impacto). Por ejemplo, nadar, hacer ejercicios [employer], y Lobbyist. Instrucciones generales  [employer] de venta libre y los recetados solamente como se lo haya indicado el mdico.  Si el mdico se lo indica, use una frula nocturna para dormir. Afloje la frula si los dedos de los pies se le entumecen, siente hormigueos o se le enfran y se tornan de Research officer, trade union.  Mantenga un peso saludable, o colabore con su mdico para perder [employer].  Concurra a todas las visitas de control como se lo haya indicado el mdico. Esto es importante. Comunquese con un mdico si:  Tiene sntomas que no desaparecen despus tratarlos en su casa.  Siente un dolor que [place].  Siente un dolor que afecta su capacidad de moverse o de Education officer, environmental sus actividades diarias. Resumen  La fascitis plantar es una afeccin dolorosa que se produce en el taln. Ocurre cuando la banda de tejido que AT[employer] dedos con el hueso del taln (fascia plantar) se irrita.  El sntoma principal de esta afeccin es Chief Technology Officer en el taln que puede empeorar despus de hacer mucho ejercicio o de Personal assistant parado por mucho tiempo.  El tratamiento vara, pero por lo general comienza con reposo, hielo, compresin, y elevacin (Tratamiento de RHCE) y los medicamentos de venta libre para Human resources officer. Esta informacin no tiene Theme park manager el consejo del mdico. Asegrese de hacerle al mdico cualquier pregunta que tenga. Document Released: 05/26/2005 Document Revised: 08/07/2017 Document Reviewed: 08/07/2017 Elsevier Interactive Patient Education   2019 [employer].

## 2018-10-03 LAB — CYTOLOGY - PAP
CHLAMYDIA, DNA PROBE: NEGATIVE
DIAGNOSIS: NEGATIVE
HPV: NOT DETECTED
Neisseria Gonorrhea: NEGATIVE

## 2019-05-01 DIAGNOSIS — Z1231 Encounter for screening mammogram for malignant neoplasm of breast: Secondary | ICD-10-CM | POA: Diagnosis not present

## 2019-05-25 ENCOUNTER — Encounter (HOSPITAL_COMMUNITY): Payer: Self-pay | Admitting: *Deleted

## 2019-05-25 ENCOUNTER — Emergency Department (HOSPITAL_COMMUNITY): Payer: BC Managed Care – PPO

## 2019-05-25 ENCOUNTER — Other Ambulatory Visit: Payer: Self-pay

## 2019-05-25 ENCOUNTER — Emergency Department (HOSPITAL_COMMUNITY)
Admission: EM | Admit: 2019-05-25 | Discharge: 2019-05-26 | Disposition: A | Discharge status: Home / Self Care | Payer: BC Managed Care – PPO | Attending: Emergency Medicine | Admitting: Emergency Medicine

## 2019-05-25 DIAGNOSIS — B349 Viral infection, unspecified: Secondary | ICD-10-CM | POA: Insufficient documentation

## 2019-05-25 DIAGNOSIS — R0600 Dyspnea, unspecified: Secondary | ICD-10-CM

## 2019-05-25 DIAGNOSIS — I10 Essential (primary) hypertension: Secondary | ICD-10-CM | POA: Insufficient documentation

## 2019-05-25 DIAGNOSIS — R5383 Other fatigue: Secondary | ICD-10-CM | POA: Diagnosis not present

## 2019-05-25 DIAGNOSIS — Z6832 Body mass index (BMI) 32.0-32.9, adult: Secondary | ICD-10-CM | POA: Diagnosis not present

## 2019-05-25 DIAGNOSIS — R0602 Shortness of breath: Secondary | ICD-10-CM | POA: Diagnosis not present

## 2019-05-25 DIAGNOSIS — I252 Old myocardial infarction: Secondary | ICD-10-CM | POA: Diagnosis not present

## 2019-05-25 DIAGNOSIS — E1165 Type 2 diabetes mellitus with hyperglycemia: Secondary | ICD-10-CM | POA: Diagnosis not present

## 2019-05-25 DIAGNOSIS — Z79899 Other long term (current) drug therapy: Secondary | ICD-10-CM | POA: Insufficient documentation

## 2019-05-25 DIAGNOSIS — Z20828 Contact with and (suspected) exposure to other viral communicable diseases: Secondary | ICD-10-CM | POA: Insufficient documentation

## 2019-05-25 DIAGNOSIS — R079 Chest pain, unspecified: Secondary | ICD-10-CM | POA: Diagnosis not present

## 2019-05-25 DIAGNOSIS — E6609 Other obesity due to excess calories: Secondary | ICD-10-CM | POA: Diagnosis not present

## 2019-05-25 DIAGNOSIS — R0789 Other chest pain: Secondary | ICD-10-CM | POA: Diagnosis not present

## 2019-05-25 DIAGNOSIS — J069 Acute upper respiratory infection, unspecified: Secondary | ICD-10-CM | POA: Diagnosis not present

## 2019-05-25 DIAGNOSIS — E119 Type 2 diabetes mellitus without complications: Secondary | ICD-10-CM | POA: Diagnosis not present

## 2019-05-25 LAB — BASIC METABOLIC PANEL
Anion gap: 11 (ref 5–15)
BUN: 22 mg/dL — ABNORMAL HIGH (ref 6–20)
CO2: 23 mmol/L (ref 22–32)
Calcium: 8.7 mg/dL — ABNORMAL LOW (ref 8.9–10.3)
Chloride: 104 mmol/L (ref 98–111)
Creatinine, Ser: 0.99 mg/dL (ref 0.44–1.00)
GFR calc Af Amer: >60 mL/min (ref >60)
GFR calc non Af Amer: >60 mL/min (ref >60)
Glucose, Bld: 174 mg/dL — ABNORMAL HIGH (ref 70–99)
Potassium: 3.7 mmol/L (ref 3.5–5.1)
Sodium: 138 mmol/L (ref 135–145)

## 2019-05-25 LAB — URINALYSIS, ROUTINE W REFLEX MICROSCOPIC
Bilirubin Urine: NEGATIVE
Glucose, UA: NEGATIVE mg/dL
Hgb urine dipstick: NEGATIVE
Ketones, ur: NEGATIVE mg/dL
Leukocytes,Ua: NEGATIVE
Nitrite: NEGATIVE
Protein, ur: NEGATIVE mg/dL
Specific Gravity, Urine: 1.005 (ref 1.005–1.030)
pH: 6 (ref 5.0–8.0)

## 2019-05-25 LAB — SARS CORONAVIRUS 2 BY RT PCR (HOSPITAL ORDER, PERFORMED IN ~~LOC~~ HOSPITAL LAB): SARS Coronavirus 2: NEGATIVE

## 2019-05-25 LAB — CBC
HCT: 38.3 % (ref 36.0–46.0)
Hemoglobin: 11.9 g/dL — ABNORMAL LOW (ref 12.0–15.0)
MCH: 28.4 pg (ref 26.0–34.0)
MCHC: 31.1 g/dL (ref 30.0–36.0)
MCV: 91.4 fL (ref 80.0–100.0)
Platelets: 416 10*3/uL — ABNORMAL HIGH (ref 150–400)
RBC: 4.19 MIL/uL (ref 3.87–5.11)
RDW: 13.6 % (ref 11.5–15.5)
WBC: 9.3 10*3/uL (ref 4.0–10.5)
nRBC: 0 % (ref 0.0–0.2)

## 2019-05-25 LAB — TROPONIN I (HIGH SENSITIVITY)
Troponin I (High Sensitivity): 7 ng/L (ref <18)
Troponin I (High Sensitivity): 7 ng/L (ref <18)

## 2019-05-25 LAB — PREGNANCY, URINE: Preg Test, Ur: NEGATIVE

## 2019-05-25 NOTE — ED Triage Notes (Signed)
Pt c/o mid chest pain, heavy breathing with ambulation, lethargy, fever of 100.5, back pain x 1 week. Pt was seen by [facility] ED and had covid test done which was negative on 9/22. Pt also c/o back pain and had hematuria a few days ago, but none now.

## 2019-05-26 NOTE — ED Provider Notes (Signed)
**Note Walls-Identified via Obfuscation** [facility] Note   CSN: [id number] Arrival date & time: 05/25/19  1718     History   Chief Complaint Chief Complaint  Patient presents with  . Chest Pain    HPI Jaime BarrioVeronica O Katina DungMendoza Walls Mendoza is a 48 y.o. female with a history of DM, htn and hypercholesterolemia, also with history of MI, but prior records suggest she had a broken heart when she split with her husband, no record of acute MI, presenting with flu like symptoms including cough, shortness of breath, fever to 100.5, reports of mid chest pain described as heavy in character, constant with radiation to her back. She denies wheezing. Cough has been nonproductive.  She also reports hematuria several days ago, now resolved.  She denies heart palpitations, dizziness, diaphoresis, n/v/d or abdominal pain.  She has had no treatment for symptoms. [facility] sx at which time a Covid test was negative.        The history is provided by the patient.    Past Medical History:  Diagnosis Date  . Diabetes mellitus without complication (HCC)   . Heart attack (HCC)   . High cholesterol   . Hypertension     Patient Active Problem List   Diagnosis Date Noted  . Pain in left elbow 08/31/2016    Past Surgical History:  Procedure Laterality Date  . ELBOW SURGERY       OB History    Gravida  5   Para  5   Term  4   Preterm  1   AB      Living  4     SAB      TAB      Ectopic      Multiple      Live Births  4            Home Medications    Prior to Admission medications   Medication Sig Start Date End Date Taking? Authorizing Provider  lisinopril (PRINIVIL,ZESTRIL) 10 MG tablet Take 10 mg by mouth as needed.   Yes [provider]  meloxicam (MOBIC) 15 MG tablet Take 15 mg by mouth as needed for pain.   Yes [provider]  acetaminophen (TYLENOL) 500 MG tablet Take 500 mg by mouth every 6 (six) hours as needed for  mild pain or moderate pain.    [provider]  Butalbital-APAP-Caffeine 50-300-40 MG CAPS Take 1 tablet by mouth every 6 (six) hours as needed (for pain).     [provider]    Family History Family History  Problem Relation Age of Onset  . Miscarriages / [place] Son   . Stroke Father   . Diabetes Mother   . Hypertension Mother   . Other Mother        breathing issues; knee pain    Social History Social History   Tobacco Use  . Smoking status: Never Smoker  . Smokeless tobacco: Never Used  Substance Use Topics  . Alcohol use: No  . Drug use: No     Allergies   Patient has no known allergies.   Review of Systems Review of Systems  Constitutional: Positive for fever. Negative for chills.  HENT: Negative for congestion, ear pain, rhinorrhea, sinus pressure, sore throat, trouble swallowing and voice change.   Eyes: Negative for discharge.  Respiratory: Positive for cough. Negative for shortness of breath, wheezing and stridor.   Cardiovascular: Positive for  chest pain.  Gastrointestinal: Negative for abdominal pain, nausea and vomiting.  Genitourinary: Positive for hematuria. Negative for dysuria.  Musculoskeletal: Negative.      Physical Exam Updated Vital Signs BP (!) 151/89   Pulse 95   Temp 98.6 F (37 C) (Oral)   Resp (!) 25   Ht 5\' 4"  (1.626 m)   Wt 86.2 kg   LMP 04/24/2019   SpO2 98%   BMI 32.61 kg/m   Physical Exam Constitutional:      Appearance: She is well-developed.  HENT:     Head: Normocephalic and atraumatic.     Right Ear: Tympanic membrane and ear canal normal.     Left Ear: Tympanic membrane and ear canal normal.     Nose: Mucosal edema and rhinorrhea present.     Mouth/Throat:     Pharynx: Uvula midline. No oropharyngeal exudate or posterior oropharyngeal erythema.     Tonsils: No tonsillar abscesses.  Eyes:     Conjunctiva/sclera: Conjunctivae normal.  Cardiovascular:     Rate and Rhythm: Normal rate.      Heart sounds: Normal heart sounds.  Pulmonary:     Effort: Pulmonary effort is normal. No respiratory distress.     Breath sounds: No decreased breath sounds, wheezing, rhonchi or rales.  Abdominal:     Palpations: Abdomen is soft.     Tenderness: There is no abdominal tenderness.  Musculoskeletal: Normal range of motion.  Skin:    General: Skin is warm and dry.     Findings: No rash.  Neurological:     Mental Status: She is alert and oriented to person, place, and time.      ED Treatments / Results  Labs (all labs ordered are listed, but only abnormal results are displayed) Labs Reviewed  BASIC METABOLIC PANEL - Abnormal; Notable for the following components:      Result Value   Glucose, Bld 174 (*)    BUN 22 (*)    Calcium 8.7 (*)    All other components within normal limits  CBC - Abnormal; Notable for the following components:   Hemoglobin 11.9 (*)    Platelets 416 (*)    All other components within normal limits  URINALYSIS, ROUTINE W REFLEX MICROSCOPIC - Abnormal; Notable for the following components:   Color, Urine STRAW (*)    All other components within normal limits  SARS CORONAVIRUS 2 (HOSPITAL ORDER, PERFORMED IN [facility])  PREGNANCY, URINE  TROPONIN I (HIGH SENSITIVITY)  TROPONIN I (HIGH SENSITIVITY)    EKG EKG Interpretation  Date/Time:  Friday May 25 2019 17:47:46 EDT Ventricular Rate:  91 PR Interval:  178 QRS Duration: 76 QT Interval:  358 QTC Calculation: 440 R Axis:   90 Text Interpretation:  Normal sinus rhythm Rightward axis Septal infarct , age undetermined Abnormal ECG Confirmed by 12-24[birth year]1983 Gilda Crease) on 05/27/2019 9:14:05 AM   Radiology Dg Chest Port 1 View  Result Date: 05/25/2019 CLINICAL DATA:  Chest pain. EXAM: PORTABLE CHEST 1 VIEW COMPARISON:  Radiographs of April 05, 2017. FINDINGS: The heart size and mediastinal contours are within normal limits. Both lungs are clear. No pneumothorax or pleural  effusion is noted. The visualized skeletal structures are unremarkable. IMPRESSION: No active disease. Electronically Signed   By: April 07, 2017 M.D.   On: 05/25/2019 21:50    Procedures Procedures (including critical care time)  Medications Ordered in ED Medications - No data to display   Initial Impression / Assessment and  Plan / ED Course  I have reviewed the triage vital signs and the nursing notes.  Pertinent labs & imaging results that were available during my care of the patient were reviewed by me and considered in my medical decision making (see chart for details).        Labs, ekg and cxr, exam suggesting viral uri.  Covid testing here negative again.  Pt's VSS, no acute distress.  Trop x 2 negative, doubt ACS.  Symptomatic home tx discussed. F/u pcp prn.  Jaime Walls was evaluated in Emergency Department on 05/27/2019 for the symptoms described in the history of present illness. She was evaluated in the context of the global COVID-19 pandemic, which necessitated consideration that the patient might be at risk for infection with the SARS-CoV-2 virus that causes COVID-19. Institutional protocols and algorithms that pertain to the evaluation of patients at risk for COVID-19 are in a state of rapid change based on information released by regulatory bodies including the CDC and federal and state organizations. These policies and algorithms were followed during the patient's care in the ED.   Final Clinical Impressions(s) / ED Diagnoses   Final diagnoses:  Viral syndrome    ED Discharge Orders    None       Landis Martins 05/27/19 2247    Milton Ferguson, MD 05/30/19 1015

## 2019-05-26 NOTE — ED Notes (Signed)
Pt ambulated once around nurses' station without difficulty. Pt's O2 sats were 98% at beginning of ambulation and remained at 98% during and after ambulation. Jaime Jefferson, PA-C notified of this.

## 2019-05-26 NOTE — Discharge Instructions (Signed)
Rest,  Drink plenty of fluids.  Take motrin or tylenol for achiness and fever reduction.   Get rechecked for increased shortness of breath,  Increased fever or increasing weakness.  Your lab tests, ekg and chest xray and exam are reassuring tonight and your Covid test is again negative.  Plan a followup visit with your primary MD next week for a recheck of your symptoms.

## 2019-05-28 DIAGNOSIS — E6609 Other obesity due to excess calories: Secondary | ICD-10-CM | POA: Diagnosis not present

## 2019-05-28 DIAGNOSIS — E119 Type 2 diabetes mellitus without complications: Secondary | ICD-10-CM | POA: Diagnosis not present

## 2019-05-28 DIAGNOSIS — Z6832 Body mass index (BMI) 32.0-32.9, adult: Secondary | ICD-10-CM | POA: Diagnosis not present

## 2019-08-09 DIAGNOSIS — E1165 Type 2 diabetes mellitus with hyperglycemia: Secondary | ICD-10-CM | POA: Diagnosis not present

## 2019-08-09 DIAGNOSIS — Z6833 Body mass index (BMI) 33.0-33.9, adult: Secondary | ICD-10-CM | POA: Diagnosis not present

## 2019-08-09 DIAGNOSIS — I10 Essential (primary) hypertension: Secondary | ICD-10-CM | POA: Diagnosis not present

## 2019-08-09 DIAGNOSIS — E6609 Other obesity due to excess calories: Secondary | ICD-10-CM | POA: Diagnosis not present

## 2019-08-09 DIAGNOSIS — M25552 Pain in left hip: Secondary | ICD-10-CM | POA: Diagnosis not present

## 2019-10-22 ENCOUNTER — Encounter: Payer: Self-pay | Admitting: Cardiology

## 2019-10-22 ENCOUNTER — Encounter: Payer: Self-pay | Admitting: *Deleted

## 2019-10-22 NOTE — Progress Notes (Signed)
Cardiology Office Note  Date: 10/23/2019   ID: Jaime Walls, DOB 1971-07-27, MRN [id number]  PCP:  Jaime Sites, MD  Consulting Cardiologist: Jaime Sark, MD Electrophysiologist:  None   Chief Complaint  Patient presents with  . History of chest pain    History of Present Illness: Jaime Walls is a 49 y.o. female referred for cardiology consultation by Ms. Glennon Mac PA-C for evaluation of heart murmur and chest discomfort.  She is here today with her daughter and also a Jaime Walls.  She describes a longstanding history of recurrent headaches and chest discomfort.  She recalls an event when she had an argument with her husband back in 2005, apparently fell to the floor upset, was "paralyzed" not able to get up.  She was taken to the hospital with complaints of headache and chest pain, apparently evaluated at that point and ultimately treated with pain medications.  I do not have any details beyond that.  She states that she has had recurring headaches and chest discomfort ever since then, does describe an exertional component.  She has been using Florinal and Mobic for control of pain, although was told by her PCP more recently that she should be using Tylenol instead.  There is no clearly documented history of previous myocardial infarction, no congenital heart disease known.  She was told that she had a heart murmur on examination.  No history of endocarditis.  Records indicate ER visit back in September 2020 with chest pain.  She ruled out for ACS and her ECG was nonacute, anteroseptal Q waves noted.  Chest x-ray showed no acute findings, SARS coronavirus 2 test was negative, she also had associated URI symptoms with low-grade temperature elevation.  She has a history of hypertension and type 2 diabetes mellitus, currently on lisinopril and Metformin.  I personally reviewed her ECG today which shows sinus rhythm with decreased anteroseptal R  waves.  She has not undergone any prior cardiac structural or ischemic testing as best I can ascertain.  Past Medical History:  Diagnosis Date  . Essential hypertension   . History of Helicobacter pylori infection   . Hyperlipidemia   . Migraine   . Type 2 diabetes mellitus ([place])     Past Surgical History:  Procedure Laterality Date  . ELBOW SURGERY      Current Outpatient Medications  Medication Sig Dispense Refill  . acetaminophen (TYLENOL) 500 MG tablet Take 500 mg by mouth every 6 (six) hours as needed for mild pain or moderate pain.    . Butalbital-APAP-Caffeine 50-300-40 MG CAPS Take 1 tablet by mouth every 6 (six) hours as needed (for pain).     Marland Kitchen lisinopril (PRINIVIL,ZESTRIL) 10 MG tablet Take 10 mg by mouth as needed.    . meloxicam (MOBIC) 15 MG tablet Take 15 mg by mouth daily.     . metFORMIN (GLUCOPHAGE) 500 MG tablet Take 1 tablet by mouth in the morning and at bedtime.    Marland Kitchen omeprazole (PRILOSEC) 20 MG capsule Take 20 mg by mouth 2 (two) times daily.     No current facility-administered medications for this visit.   Allergies:  Patient has no known allergies.   Social History: The patient  reports that she has never smoked. She has never used smokeless tobacco. She reports that she does not drink alcohol or use drugs.   Family History: The patient's family history includes Asthma in her mother; CAD in her mother; Diabetes in  her father.   ROS:  Please see the history of present illness. Otherwise, complete review of systems is positive for none.  All other systems are reviewed and negative.   Physical Exam: VS:  BP (!) 154/99   Pulse 80   Ht _0  (1.626 m)   Wt 185 lb 6.4 oz (84.1 kg)   SpO2 99%   BMI 31.82 kg/m , BMI Body mass index is 31.82 kg/m.  Wt Readings from Last 3 Encounters:  10/23/19 185 lb 6.4 oz (84.1 kg)  05/25/19 190 lb (86.2 kg)  09/27/18 190 lb (86.2 kg)    General: Patient appears comfortable at rest. HEENT: Conjunctiva and lids  normal, wearing a mask. Neck: Supple, no elevated JVP or carotid bruits, no thyromegaly. Lungs: Clear to auscultation, nonlabored breathing at rest. Cardiac: Regular rate and rhythm, no S3, 2/6 basal systolic murmur, no pericardial rub. Abdomen: Soft, nontender, bowel sounds present, no guarding or rebound. Extremities: No pitting edema, distal pulses 2+. Skin: Warm and dry. Musculoskeletal: No kyphosis. Neuropsychiatric: Alert and oriented x3, affect grossly appropriate.  ECG:  An ECG dated 05/25/2019 was personally reviewed today and demonstrated:  Sinus rhythm with rightward axis and anteroseptal Q waves.  Recent Labwork: 05/25/2019: BUN 22; Creatinine, Ser 0.99; Hemoglobin 11.9; Platelets 416; Potassium 3.7; Sodium 138, high-sensitivity troponin I levels of 7 (x2), SARS coronavirus 2 test negative September 2020: Hemoglobin 11.9, platelets 403, BUN 16, creatinine 1.17, potassium 4.4, AST 27, ALT 39, cholesterol 163, triglycerides 234, HDL 18, LDL 104, ESR 78, TSH 1.83, hemoglobin A1c 7.4%  Other Studies Reviewed Today:   Chest x-ray 05/25/2019: FINDINGS: The heart size and mediastinal contours are within normal limits. Both lungs are clear. No pneumothorax or pleural effusion is noted. The visualized skeletal structures are unremarkable.  IMPRESSION: No active disease.  Assessment and Plan:  1.  Longstanding history of recurrent chest discomfort and headaches, not necessarily occurring at the same time, but with an exertional component.  She reports a sudden stressful event back in 2005 as described above, possibly could have had a stress-induced cardiomyopathy at that point, but has not had any recent cardiac structural or ischemic testing and has a personal history of hypertension and type 2 diabetes mellitus.  ECG is abnormal but nonspecific overall.  We will plan to proceed with an exercise echocardiogram for ischemic evaluation.  2.  Systolic murmur, no known history of  congenital heart disease.  Complete transthoracic echocardiogram will be obtained for further assessment.  This will be done at the time of her exercise echocardiogram.  3.  Recurring headaches.  Describes these posteriorly and left-sided.  She has used Florinal and Mobic based on review of medications, was told to use Tylenol by PCP.  Not clear that she has had evaluation by neurology, this would certainly be a consideration given longstanding history.  I note incidentally that she had an elevated ESR of 78 when she had lab work by PCP in September of last year.  May want to consider undiagnosed connective tissue disease or even temporal arteritis in the differential.  She should follow-up with PCP and consider neurology evaluation.  4.  Essential hypertension, blood pressure elevated today.  She is currently on lisinopril.  No adjustments were made.  5.  Type 2 diabetes mellitus, currently on Metformin.  Medication Adjustments/Labs and Tests Ordered: Current medicines are reviewed at length with the patient today.  Concerns regarding medicines are outlined above.   Tests Ordered: Orders Placed This Encounter  Procedures  . EKG 12-Lead  . ECHOCARDIOGRAM COMPLETE  . ECHOCARDIOGRAM STRESS TEST    Medication Changes: No orders of the defined types were placed in this encounter.   Disposition:  Follow up test results.  Signed, Jaime Sark, MD, [facility] 10/23/2019 10:24 AM    [facility] [place], [place], [place] [address] Phone: [phone]; Fax: [phone]

## 2019-10-23 ENCOUNTER — Ambulatory Visit (INDEPENDENT_AMBULATORY_CARE_PROVIDER_SITE_OTHER): Payer: BC Managed Care – PPO | Admitting: Cardiology

## 2019-10-23 ENCOUNTER — Encounter: Payer: Self-pay | Admitting: Cardiology

## 2019-10-23 ENCOUNTER — Encounter: Payer: Self-pay | Admitting: *Deleted

## 2019-10-23 ENCOUNTER — Other Ambulatory Visit: Payer: Self-pay

## 2019-10-23 VITALS — BP 154/99 | HR 80 | Ht 64.0 in | Wt 185.4 lb

## 2019-10-23 DIAGNOSIS — R519 Headache, unspecified: Secondary | ICD-10-CM | POA: Diagnosis not present

## 2019-10-23 DIAGNOSIS — R079 Chest pain, unspecified: Secondary | ICD-10-CM

## 2019-10-23 DIAGNOSIS — R011 Cardiac murmur, unspecified: Secondary | ICD-10-CM | POA: Diagnosis not present

## 2019-10-23 DIAGNOSIS — I10 Essential (primary) hypertension: Secondary | ICD-10-CM | POA: Diagnosis not present

## 2019-10-23 NOTE — Patient Instructions (Addendum)
Medication Instructions:    Your physician recommends that you continue on your current medications as directed. Please refer to the Current Medication list given to you today.  Labwork: You will need to do a covid test 3-4 days before your stress echo. You will be asked to quarantine until after your stress echo is complete. Your covid test is on November 02, 2019 @1 :45 pm. [facility] [address] [place], [place]  Testing/Procedures: Your physician has requested that you have an echocardiogram. Echocardiography is a painless test that uses sound waves to create images of your heart. It provides your doctor with information about the size and shape of your heart and how well your heart's chambers and valves are working. This procedure takes approximately one hour. There are no restrictions for this procedure. Your physician has requested that you have a stress echocardiogram. For further information please visit [place]. Please follow instruction sheet as given.  Follow-Up:  Your physician recommends that you schedule a follow-up appointment in: pending test.   Any Other Special Instructions Will Be Listed Below (If Applicable).  If you need a refill on your cardiac medications before your next appointment, please call your pharmacy.

## 2019-10-25 ENCOUNTER — Telehealth: Payer: Self-pay | Admitting: Cardiology

## 2019-10-25 NOTE — Telephone Encounter (Signed)
Pre-cert Verification for the following procedure    Stress echo & Echo scheduled for 11-06-2019 at [facility]

## 2019-10-30 ENCOUNTER — Other Ambulatory Visit (HOSPITAL_COMMUNITY): Payer: BC Managed Care – PPO

## 2019-11-02 ENCOUNTER — Other Ambulatory Visit: Payer: Self-pay

## 2019-11-02 ENCOUNTER — Other Ambulatory Visit (HOSPITAL_COMMUNITY): Payer: BC Managed Care – PPO

## 2019-11-02 ENCOUNTER — Other Ambulatory Visit (HOSPITAL_COMMUNITY)
Admission: RE | Admit: 2019-11-02 | Discharge: 2019-11-02 | Disposition: A | Discharge status: Home / Self Care | Payer: BC Managed Care – PPO | Source: Ambulatory Visit | Attending: Cardiology | Admitting: Cardiology

## 2019-11-02 DIAGNOSIS — Z01812 Encounter for preprocedural laboratory examination: Secondary | ICD-10-CM | POA: Diagnosis not present

## 2019-11-02 DIAGNOSIS — Z20822 Contact with and (suspected) exposure to covid-19: Secondary | ICD-10-CM | POA: Insufficient documentation

## 2019-11-03 LAB — SARS CORONAVIRUS 2 (TAT 6-24 HRS): SARS Coronavirus 2: NEGATIVE

## 2019-11-06 ENCOUNTER — Other Ambulatory Visit: Payer: Self-pay

## 2019-11-06 ENCOUNTER — Telehealth: Payer: Self-pay | Admitting: *Deleted

## 2019-11-06 ENCOUNTER — Ambulatory Visit (HOSPITAL_COMMUNITY)
Admission: RE | Admit: 2019-11-06 | Discharge: 2019-11-06 | Disposition: A | Discharge status: Home / Self Care | Payer: BC Managed Care – PPO | Source: Ambulatory Visit | Attending: Cardiology | Admitting: Cardiology

## 2019-11-06 ENCOUNTER — Ambulatory Visit (HOSPITAL_BASED_OUTPATIENT_CLINIC_OR_DEPARTMENT_OTHER)
Admission: RE | Admit: 2019-11-06 | Discharge: 2019-11-06 | Disposition: A | Discharge status: Home / Self Care | Payer: BC Managed Care – PPO | Source: Ambulatory Visit | Attending: Cardiology | Admitting: Cardiology

## 2019-11-06 ENCOUNTER — Telehealth: Payer: Self-pay | Admitting: Cardiology

## 2019-11-06 DIAGNOSIS — R011 Cardiac murmur, unspecified: Secondary | ICD-10-CM | POA: Diagnosis not present

## 2019-11-06 DIAGNOSIS — R079 Chest pain, unspecified: Secondary | ICD-10-CM | POA: Diagnosis not present

## 2019-11-06 NOTE — Telephone Encounter (Signed)
Returned call to pt. No answer. Left msg to call back.  

## 2019-11-06 NOTE — Telephone Encounter (Signed)
-----   Message from Jonelle Sidle, MD sent at 11/06/2019  3:12 PM EST ----- Results reviewed.  Please let her know that the stress test looked good, no evidence of obstructive CAD to cause symptoms and low risk overall.  No further cardiac testing is planned at this time.  She may continue with regular job responsibilities and keep follow-up with PCP.

## 2019-11-06 NOTE — Progress Notes (Signed)
*  PRELIMINARY RESULTS* Echocardiogram Echocardiogram Stress Test has been performed.  Stacey Drain 11/06/2019, 1:31 PM

## 2019-11-06 NOTE — Telephone Encounter (Signed)
Pt had stress echo done today, would like to know when she's able to go back to work.

## 2019-11-06 NOTE — Telephone Encounter (Signed)
-----   Message from Jonelle Sidle, MD sent at 11/06/2019  3:13 PM EST ----- Results reviewed.  Surface echocardiogram shows normal cardiac function and no major valvular abnormalities.  Heart murmur is likely related to flow across the aortic valve, she does not have any stenosis visualized however.  Keep follow-up with PCP.

## 2019-11-06 NOTE — Progress Notes (Signed)
*  PRELIMINARY RESULTS* Echocardiogram 2D Echocardiogram has been performed.  Stacey Drain 11/06/2019, 12:36 PM

## 2019-11-07 NOTE — Telephone Encounter (Signed)
Daughter informed and verbalized understanding. Copy sent to PCP 

## 2019-11-07 NOTE — Telephone Encounter (Signed)
See new phone note for details 

## 2019-11-15 ENCOUNTER — Ambulatory Visit: Payer: BC Managed Care – PPO | Admitting: Cardiology

## 2019-12-14 DIAGNOSIS — Z6833 Body mass index (BMI) 33.0-33.9, adult: Secondary | ICD-10-CM | POA: Diagnosis not present

## 2019-12-14 DIAGNOSIS — G44209 Tension-type headache, unspecified, not intractable: Secondary | ICD-10-CM | POA: Diagnosis not present

## 2019-12-14 DIAGNOSIS — E1165 Type 2 diabetes mellitus with hyperglycemia: Secondary | ICD-10-CM | POA: Diagnosis not present

## 2019-12-14 DIAGNOSIS — E6609 Other obesity due to excess calories: Secondary | ICD-10-CM | POA: Diagnosis not present

## 2020-01-11 DIAGNOSIS — Z6832 Body mass index (BMI) 32.0-32.9, adult: Secondary | ICD-10-CM | POA: Diagnosis not present

## 2020-01-11 DIAGNOSIS — G44209 Tension-type headache, unspecified, not intractable: Secondary | ICD-10-CM | POA: Diagnosis not present

## 2020-01-11 DIAGNOSIS — Z1389 Encounter for screening for other disorder: Secondary | ICD-10-CM | POA: Diagnosis not present

## 2020-01-11 DIAGNOSIS — E6609 Other obesity due to excess calories: Secondary | ICD-10-CM | POA: Diagnosis not present

## 2020-01-11 DIAGNOSIS — E1165 Type 2 diabetes mellitus with hyperglycemia: Secondary | ICD-10-CM | POA: Diagnosis not present

## 2020-01-30 ENCOUNTER — Emergency Department (HOSPITAL_COMMUNITY): Payer: BC Managed Care – PPO | Attending: Emergency Medicine

## 2020-01-30 ENCOUNTER — Encounter (HOSPITAL_COMMUNITY): Payer: Self-pay | Admitting: *Deleted

## 2020-01-30 ENCOUNTER — Emergency Department (HOSPITAL_COMMUNITY)
Admission: EM | Admit: 2020-01-30 | Discharge: 2020-01-30 | Disposition: A | Discharge status: Home / Self Care | Payer: Worker's Compensation | Attending: Emergency Medicine | Admitting: Emergency Medicine

## 2020-01-30 ENCOUNTER — Other Ambulatory Visit: Payer: Self-pay

## 2020-01-30 DIAGNOSIS — W208XXA Other cause of strike by thrown, projected or falling object, initial encounter: Secondary | ICD-10-CM | POA: Insufficient documentation

## 2020-01-30 DIAGNOSIS — Y9389 Activity, other specified: Secondary | ICD-10-CM | POA: Insufficient documentation

## 2020-01-30 DIAGNOSIS — Y929 Unspecified place or not applicable: Secondary | ICD-10-CM | POA: Diagnosis not present

## 2020-01-30 DIAGNOSIS — Y990 Civilian activity done for income or pay: Secondary | ICD-10-CM | POA: Diagnosis not present

## 2020-01-30 DIAGNOSIS — S6991XA Unspecified injury of right wrist, hand and finger(s), initial encounter: Secondary | ICD-10-CM | POA: Diagnosis present

## 2020-01-30 DIAGNOSIS — S60221A Contusion of right hand, initial encounter: Secondary | ICD-10-CM

## 2020-01-30 MED ORDER — NAPROXEN 500 MG PO TABS: 500.0000 mg | ORAL_TABLET | Freq: Two times a day (BID) | ORAL | 0 refills | Status: AC

## 2020-01-30 NOTE — ED Provider Notes (Signed)
[facility] EMERGENCY DEPARTMENT Provider Note   CSN: [id number] Arrival date & time: 01/30/20  1527     History Chief Complaint  Patient presents with  . Hand Injury   Patient is Spanish-speaking.  I offered translator however she declined as her family members in the room translating for her.  Jaime Walls is a 49 y.o. female.  HPI   49 year old female with a history of hypertension, hyperlipidemia, migraines, H. pylori, diabetes, presenting to the emergency department today for evaluation of right hand pain.  States that she had a large metal lid fall onto her right hand today while she was at work.  She has increased swelling and pain to the right hand and to the palmar aspect.  Pain is worse with movement.  Is constant and severe in nature.  Initially she had some numbness to the right middle finger however that has since improved.  Past Medical History:  Diagnosis Date  . Essential hypertension   . History of Helicobacter pylori infection   . Hyperlipidemia   . Migraine   . Type 2 diabetes mellitus [facility])     Patient Active Problem List   Diagnosis Date Noted  . Pain in left elbow 08/31/2016    Past Surgical History:  Procedure Laterality Date  . ELBOW SURGERY       OB History    Gravida  5   Para  5   Term  4   Preterm  1   AB      Living  4     SAB      TAB      Ectopic      Multiple      Live Births  4           Family History  Problem Relation Age of Onset  . Diabetes Father   . CAD Mother   . Asthma Mother     Social History   Tobacco Use  . Smoking status: Never Smoker  . Smokeless tobacco: Never Used  Substance Use Topics  . Alcohol use: No  . Drug use: No    Home Medications Prior to Admission medications   Medication Sig Start Date End Date Taking? Authorizing Provider  acetaminophen (TYLENOL) 500 MG tablet Take 500 mg by mouth every 6 (six) hours as needed for mild pain or moderate pain.     [provider]  Butalbital-APAP-Caffeine 50-300-40 MG CAPS Take 1 tablet by mouth every 6 (six) hours as needed (for pain).     [provider]  lisinopril (PRINIVIL,ZESTRIL) 10 MG tablet Take 10 mg by mouth as needed.    [provider]  meloxicam (MOBIC) 15 MG tablet Take 15 mg by mouth daily.     [provider]  metFORMIN (GLUCOPHAGE) 500 MG tablet Take 1 tablet by mouth in the morning and at bedtime. 07/31/19   [provider]  naproxen (NAPROSYN) 500 MG tablet Take 1 tablet (500 mg total) by mouth 2 (two) times daily for 7 days. 01/30/20 02/06/20  Makalah Asberry S, PA-C  omeprazole (PRILOSEC) 20 MG capsule Take 20 mg by mouth 2 (two) times daily. 08/09/19   [provider]    Allergies    Patient has no known allergies.  Review of Systems   Review of Systems  Constitutional: Negative for fever.  Musculoskeletal:       Right hand pain  Skin: Positive for color change (bruising). Negative for wound.  Neurological: Positive for numbness (resolved).    Physical Exam Updated Vital Signs BP (!) 156/97 (BP Location: Right Arm)   Pulse 76   Temp 98.1 F (36.7 C) (Oral)   Resp 16   Ht 5\' 5"  (1.651 m)   Wt 86.2 kg   LMP 01/16/2020   SpO2 98%   BMI 31.62 kg/m   Physical Exam Vitals and nursing note reviewed.  Constitutional:      General: She is not in acute distress.    Appearance: She is well-developed.  HENT:     Head: Normocephalic and atraumatic.  Eyes:     Conjunctiva/sclera: Conjunctivae normal.  Cardiovascular:     Rate and Rhythm: Normal rate.  Pulmonary:     Effort: Pulmonary effort is normal.  Musculoskeletal:        General: Normal range of motion.     Cervical back: Neck supple.     Comments: TTP over the right 2nd 3rd and 4th MCP joints of the right hand with a small bruise noted to the palmar aspect. Grip strength is slightly decreased secondary to pain.  Sensation is intact all fingers.  Brisk cap refill  to all fingers.  No open wounds.  Skin:    General: Skin is warm and dry.  Neurological:     Mental Status: She is alert.     ED Results / Procedures / Treatments   Labs (all labs ordered are listed, but only abnormal results are displayed) Labs Reviewed - No data to display  EKG None  Radiology DG Hand Complete Right  Result Date: 01/30/2020 CLINICAL DATA:  Heavy object fell on hand EXAM: RIGHT HAND - COMPLETE 3+ VIEW COMPARISON:  July 29, 2004 FINDINGS: Frontal, oblique, and lateral views were obtained. There is soft tissue swelling of the second, third, and fourth digits. No acute fracture or dislocation. A small focus of calcification medial to the distal aspect of the third middle phalanx may represent residua of old trauma. Joint spaces appear normal. No erosive change. Fifth middle phalanx is somewhat foreshortened, an apparent anatomic variant which is stable. IMPRESSION: Soft tissue swelling of the second, third, and fourth digits. No acute fracture or dislocation. Question residua of old trauma medial to the distal portion of the third middle phalanx. Joint spaces appear unremarkable. Electronically Signed   By: July 31, 2004 III M.D.   On: 01/30/2020 17:00    Procedures Procedures (including critical care time)  Medications Ordered in ED Medications - No data to display  ED Course  I have reviewed the triage vital signs and the nursing notes.  Pertinent labs & imaging results that were available during my care of the patient were reviewed by me and considered in my medical decision making (see chart for details).    MDM Rules/Calculators/A&P                      49 year old female presenting for evaluation of crush injury to the right hand that occurred prior to arrival.  On exam she does have some tenderness over the second third and fourth MCPs.  X-ray reviewed and there is no evidence of fracture or dislocation.  She does have evidence of residual trauma to  the distal third middle finger thinks.  She is neurovascularly intact.  Advised Tylenol, naproxen, rice protocol.  Will give PCP follow-up and advised on return precautions.  She voiced understanding plan reasons return but all questions answered.  Patient stable for discharge.  Final  Clinical Impression(s) / ED Diagnoses Final diagnoses:  Contusion of right hand, initial encounter    Rx / DC Orders ED Discharge Orders         Ordered    naproxen (NAPROSYN) 500 MG tablet  2 times daily     01/30/20 2024           Rodney Booze, PA-C 01/30/20 2024    Truddie Hidden, MD 01/30/20 2310

## 2020-01-30 NOTE — ED Triage Notes (Signed)
Pt c/o right hand pain after a large metal lid fell on her hand today while at work. Pt has swelling to right hand and decreased movement. Pt c/o pain with flexion of fingers.

## 2020-01-30 NOTE — Discharge Instructions (Addendum)
You may alternate taking Tylenol and Naproxen as needed for pain control. You may take Naproxen twice daily as directed on your discharge paperwork and you may take  [phone] mg of Tylenol every 6 hours. Do not exceed 4000 mg of Tylenol daily as this can lead to liver damage. Also, make sure to take Naproxen with meals as it can cause an upset stomach. Do not take other NSAIDs while taking Naproxen such as (Aleve, Ibuprofen, Aspirin, Celebrex, etc) and do not take more than the prescribed dose as this can lead to ulcers and bleeding in your GI tract. You may use warm and cold compresses to help with your symptoms.   Please follow up with your primary doctor within the next 7-10 days for re-evaluation and further treatment of your symptoms.   Please return to the ER sooner if you have any new or worsening symptoms.  -------   Puede alternar la toma de Tylenol y Naproxen segn sea necesario para controlar el dolor. Puede tomar [employer] al da como se indica en su documentacin de alta y puede tomar [phone] mg de Tylenol cada 6 horas. No exceda los 4000 mg de Tylenol al da, ya que esto puede provocar dao heptico. Adems, asegrese de tomar naproxeno con las comidas, ya que puede causar Programme researcher, broadcasting/film/video. No tome otros AINE mientras toma naproxeno como (Aleve, ibuprofeno, aspirina, Celebrex, etc.) y no tome ms de la dosis recetada, ya que esto puede provocar lceras y sangrado en su tracto gastrointestinal. Puede usar compresas fras y calientes para ayudar con sus sntomas.  Haga un seguimiento con su mdico de atencin primaria dentro de los prximos 7 a 10 das para una reevaluacin y tratamiento adicional de sus sntomas.  Regrese a la sala de emergencias antes si tiene sntomas nuevos o que empeoran.

## 2020-07-29 DIAGNOSIS — E7849 Other hyperlipidemia: Secondary | ICD-10-CM | POA: Diagnosis not present

## 2020-07-29 DIAGNOSIS — E6609 Other obesity due to excess calories: Secondary | ICD-10-CM | POA: Diagnosis not present

## 2020-07-29 DIAGNOSIS — E119 Type 2 diabetes mellitus without complications: Secondary | ICD-10-CM | POA: Diagnosis not present

## 2020-07-29 DIAGNOSIS — I10 Essential (primary) hypertension: Secondary | ICD-10-CM | POA: Diagnosis not present

## 2020-07-29 DIAGNOSIS — J302 Other seasonal allergic rhinitis: Secondary | ICD-10-CM | POA: Diagnosis not present

## 2020-07-29 DIAGNOSIS — Z6832 Body mass index (BMI) 32.0-32.9, adult: Secondary | ICD-10-CM | POA: Diagnosis not present

## 2021-08-14 DIAGNOSIS — E669 Obesity, unspecified: Secondary | ICD-10-CM | POA: Diagnosis not present

## 2021-08-14 DIAGNOSIS — I10 Essential (primary) hypertension: Secondary | ICD-10-CM | POA: Diagnosis not present

## 2021-08-14 DIAGNOSIS — Z6833 Body mass index (BMI) 33.0-33.9, adult: Secondary | ICD-10-CM | POA: Diagnosis not present

## 2021-08-14 DIAGNOSIS — E7849 Other hyperlipidemia: Secondary | ICD-10-CM | POA: Diagnosis not present

## 2021-08-14 DIAGNOSIS — E1165 Type 2 diabetes mellitus with hyperglycemia: Secondary | ICD-10-CM | POA: Diagnosis not present

## 2021-08-14 DIAGNOSIS — E782 Mixed hyperlipidemia: Secondary | ICD-10-CM | POA: Diagnosis not present

## 2021-11-22 IMAGING — DX DG HAND COMPLETE 3+V*R*
3 series · 3 of 3 positions shown · non-contrast
Comparison: July 29, 2004

CLINICAL DATA: Heavy object fell on hand

EXAM:
RIGHT HAND - COMPLETE 3+ VIEW

[hand pa]
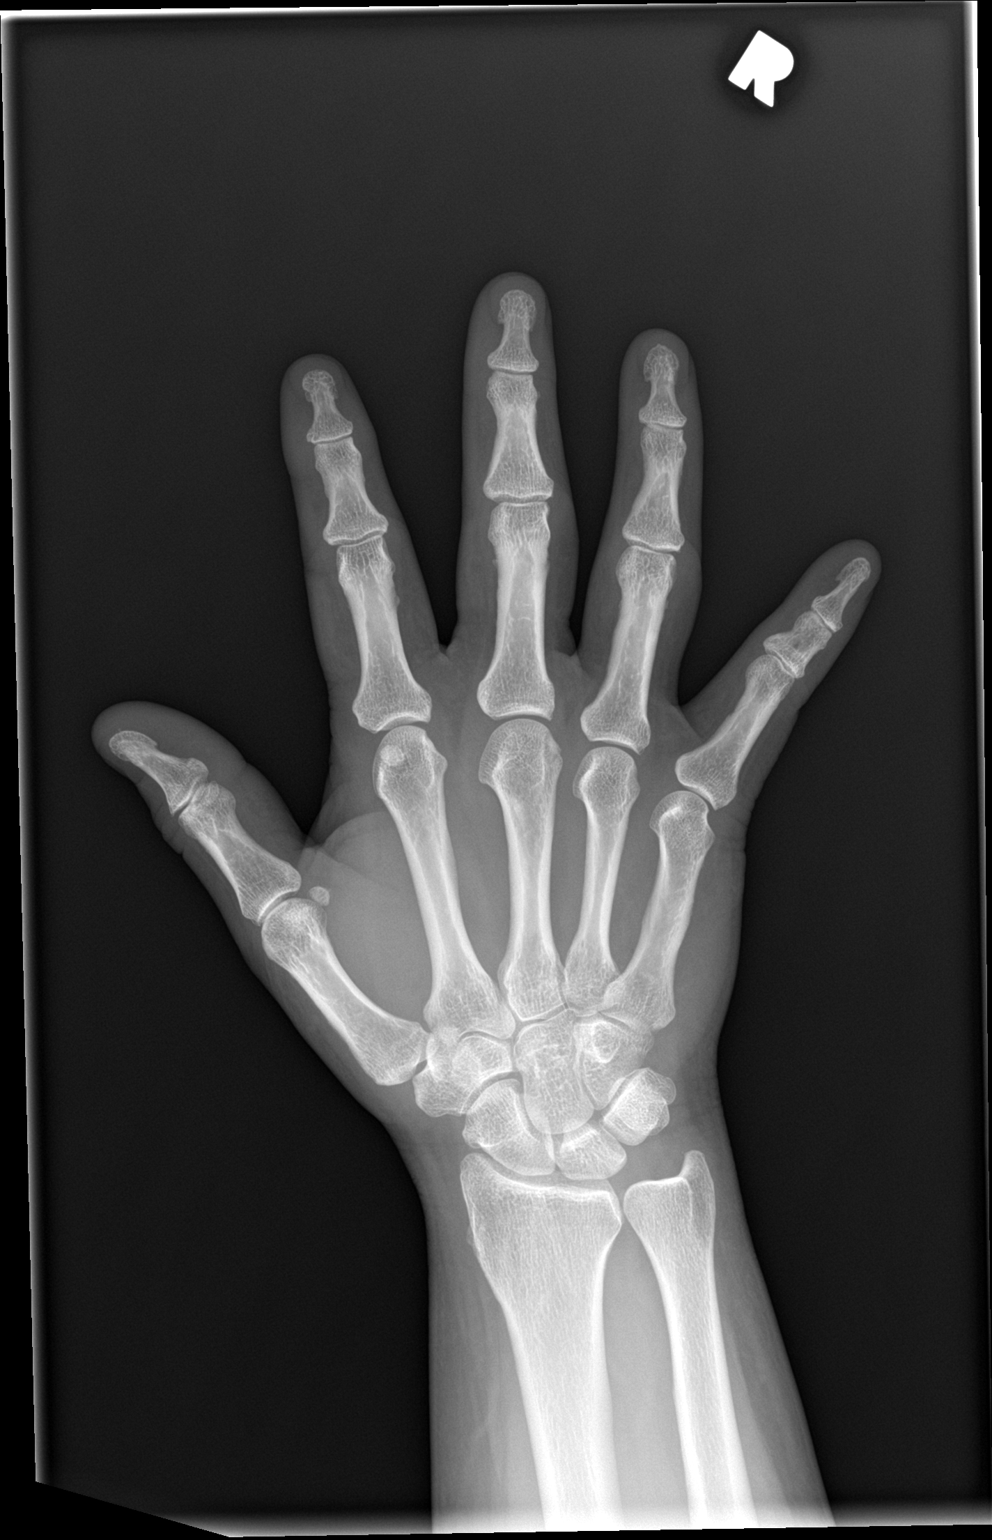

[hand obl]
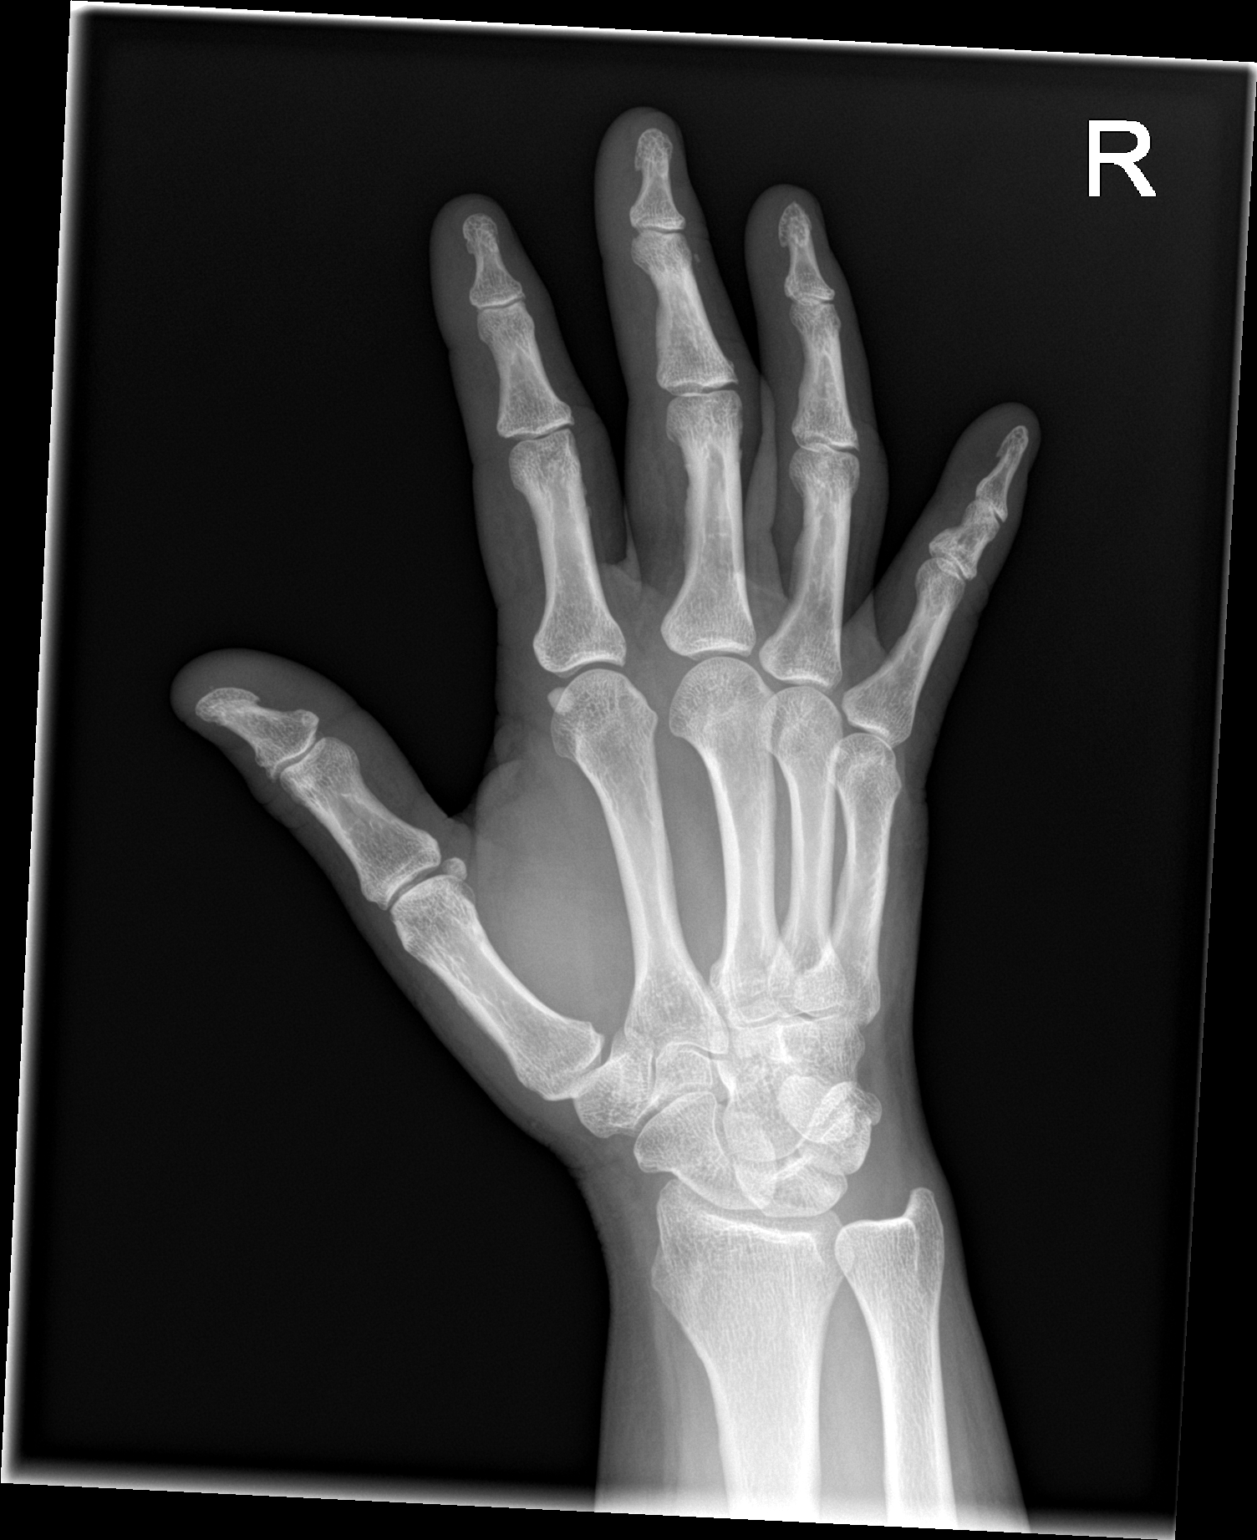

[hand lat]
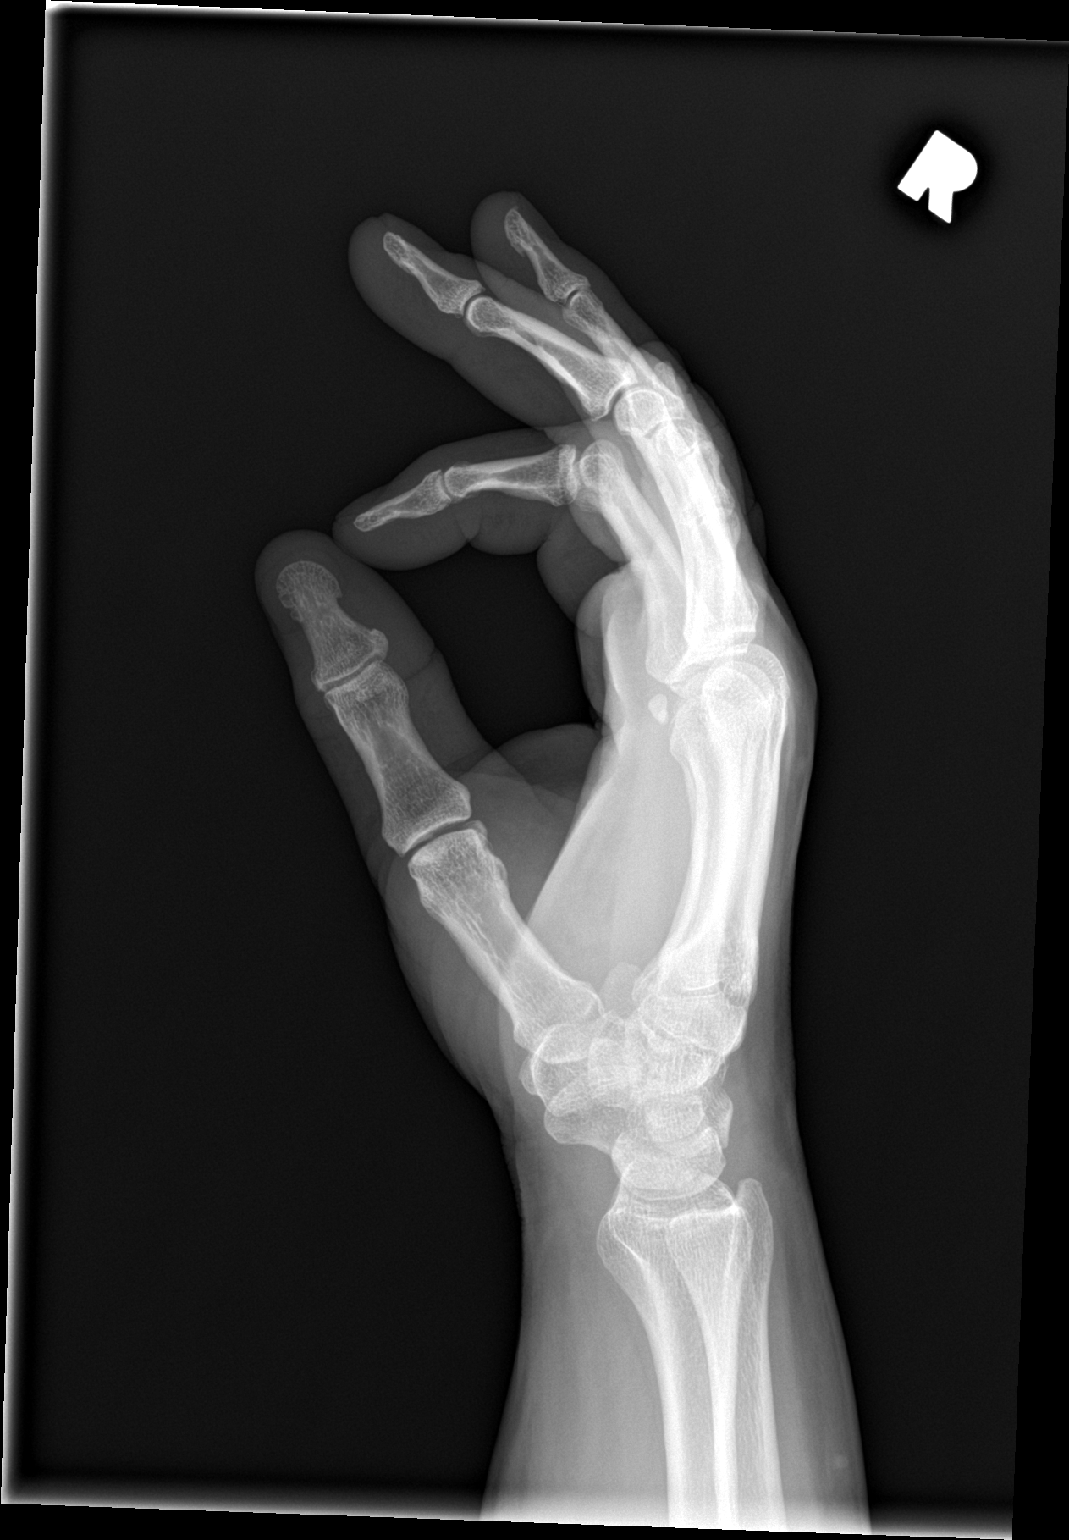

[3 of 3 positions shown; findings below may reference images not displayed]

FINDINGS: Frontal, oblique, and lateral views were obtained. There is soft
tissue swelling of the second, third, and fourth digits. No acute
fracture or dislocation. A small focus of calcification medial to
the distal aspect of the third middle phalanx may represent residua
of old trauma. Joint spaces appear normal. No erosive change. Fifth
middle phalanx is somewhat foreshortened, an apparent anatomic
variant which is stable.
IMPRESSION: Soft tissue swelling of the second, third, and fourth digits. No
acute fracture or dislocation. Question residua of old trauma medial
to the distal portion of the third middle phalanx. Joint spaces
appear unremarkable.

## 2022-03-01 DIAGNOSIS — G44229 Chronic tension-type headache, not intractable: Secondary | ICD-10-CM | POA: Diagnosis not present

## 2022-03-01 DIAGNOSIS — E6609 Other obesity due to excess calories: Secondary | ICD-10-CM | POA: Diagnosis not present

## 2022-03-01 DIAGNOSIS — Z6833 Body mass index (BMI) 33.0-33.9, adult: Secondary | ICD-10-CM | POA: Diagnosis not present

## 2022-03-24 ENCOUNTER — Other Ambulatory Visit (HOSPITAL_COMMUNITY): Payer: Self-pay | Admitting: Family Medicine

## 2022-03-24 DIAGNOSIS — Z0001 Encounter for general adult medical examination with abnormal findings: Secondary | ICD-10-CM | POA: Diagnosis not present

## 2022-03-24 DIAGNOSIS — Z6833 Body mass index (BMI) 33.0-33.9, adult: Secondary | ICD-10-CM | POA: Diagnosis not present

## 2022-03-24 DIAGNOSIS — Z1331 Encounter for screening for depression: Secondary | ICD-10-CM | POA: Diagnosis not present

## 2022-03-24 DIAGNOSIS — G44229 Chronic tension-type headache, not intractable: Secondary | ICD-10-CM | POA: Diagnosis not present

## 2022-03-24 DIAGNOSIS — Z1231 Encounter for screening mammogram for malignant neoplasm of breast: Secondary | ICD-10-CM

## 2022-03-24 DIAGNOSIS — E782 Mixed hyperlipidemia: Secondary | ICD-10-CM | POA: Diagnosis not present

## 2022-03-24 DIAGNOSIS — E7849 Other hyperlipidemia: Secondary | ICD-10-CM | POA: Diagnosis not present

## 2022-03-24 DIAGNOSIS — E669 Obesity, unspecified: Secondary | ICD-10-CM | POA: Diagnosis not present

## 2022-03-24 DIAGNOSIS — E1165 Type 2 diabetes mellitus with hyperglycemia: Secondary | ICD-10-CM | POA: Diagnosis not present

## 2022-03-24 DIAGNOSIS — I10 Essential (primary) hypertension: Secondary | ICD-10-CM | POA: Diagnosis not present

## 2023-01-06 DIAGNOSIS — I10 Essential (primary) hypertension: Secondary | ICD-10-CM | POA: Diagnosis not present

## 2023-01-06 DIAGNOSIS — E1165 Type 2 diabetes mellitus with hyperglycemia: Secondary | ICD-10-CM | POA: Diagnosis not present

## 2023-01-06 DIAGNOSIS — E782 Mixed hyperlipidemia: Secondary | ICD-10-CM | POA: Diagnosis not present

## 2023-01-13 ENCOUNTER — Ambulatory Visit: Payer: BC Managed Care – PPO | Admitting: Advanced Practice Midwife

## 2023-02-07 ENCOUNTER — Ambulatory Visit: Payer: BC Managed Care – PPO | Admitting: Advanced Practice Midwife

## 2023-03-27 DIAGNOSIS — U071 COVID-19: Secondary | ICD-10-CM | POA: Diagnosis not present

## 2024-01-10 DIAGNOSIS — E6609 Other obesity due to excess calories: Secondary | ICD-10-CM | POA: Diagnosis not present

## 2024-01-10 DIAGNOSIS — Z6831 Body mass index (BMI) 31.0-31.9, adult: Secondary | ICD-10-CM | POA: Diagnosis not present

## 2024-01-10 DIAGNOSIS — N920 Excessive and frequent menstruation with regular cycle: Secondary | ICD-10-CM | POA: Diagnosis not present

## 2024-01-10 DIAGNOSIS — I10 Essential (primary) hypertension: Secondary | ICD-10-CM | POA: Diagnosis not present

## 2024-01-10 DIAGNOSIS — E1165 Type 2 diabetes mellitus with hyperglycemia: Secondary | ICD-10-CM | POA: Diagnosis not present

## 2024-01-12 DIAGNOSIS — E1165 Type 2 diabetes mellitus with hyperglycemia: Secondary | ICD-10-CM | POA: Diagnosis not present

## 2024-01-12 DIAGNOSIS — Z0001 Encounter for general adult medical examination with abnormal findings: Secondary | ICD-10-CM | POA: Diagnosis not present
